# Patient Record
Sex: Male | Born: 1975 | Race: White | Hispanic: No | Marital: Married | Smoking: Never smoker
Health system: Southern US, Community
[De-identification: ages and names within clinical notes are randomized; demographics above are authoritative.]

## PROBLEM LIST (undated history)

## (undated) DIAGNOSIS — R1013 Epigastric pain: Secondary | ICD-10-CM

## (undated) DIAGNOSIS — K769 Liver disease, unspecified: Secondary | ICD-10-CM

## (undated) DIAGNOSIS — E042 Nontoxic multinodular goiter: Secondary | ICD-10-CM

## (undated) DIAGNOSIS — G5600 Carpal tunnel syndrome, unspecified upper limb: Secondary | ICD-10-CM

## (undated) HISTORY — DX: Epigastric pain: R10.13

## (undated) HISTORY — DX: Nontoxic multinodular goiter: E04.2

## (undated) HISTORY — DX: Carpal tunnel syndrome, unspecified upper limb: G56.00

## (undated) HISTORY — DX: Liver disease, unspecified: K76.9

## (undated) HISTORY — PX: MULTIPLE TOOTH EXTRACTIONS: SHX2053

---

## 2006-08-16 ENCOUNTER — Emergency Department: Payer: Self-pay | Admitting: Emergency Medicine

## 2012-08-24 ENCOUNTER — Ambulatory Visit: Payer: Self-pay | Admitting: Gastroenterology

## 2012-08-26 ENCOUNTER — Ambulatory Visit: Payer: Self-pay | Admitting: Gastroenterology

## 2012-08-26 LAB — CREATININE, SERUM
Creatinine: 1.13 mg/dL (ref 0.60–1.30)
EGFR (Non-African Amer.): >60

## 2012-12-08 ENCOUNTER — Ambulatory Visit: Payer: Self-pay | Admitting: Gastroenterology

## 2012-12-28 ENCOUNTER — Ambulatory Visit: Payer: Self-pay | Admitting: Gastroenterology

## 2013-06-15 ENCOUNTER — Ambulatory Visit: Payer: Self-pay | Admitting: Gastroenterology

## 2013-10-11 ENCOUNTER — Emergency Department: Payer: Self-pay | Admitting: Emergency Medicine

## 2013-10-11 LAB — URINALYSIS, COMPLETE
BACTERIA: NONE SEEN
BILIRUBIN, UR: NEGATIVE
BLOOD: NEGATIVE
Glucose,UR: NEGATIVE mg/dL (ref 0–75)
LEUKOCYTE ESTERASE: NEGATIVE
Nitrite: NEGATIVE
PH: 5 (ref 4.5–8.0)
Protein: NEGATIVE
RBC,UR: NONE SEEN /HPF (ref 0–5)
Specific Gravity: 1.028 (ref 1.003–1.030)
Squamous Epithelial: NONE SEEN

## 2013-10-11 LAB — CBC WITH DIFFERENTIAL/PLATELET
BASOS ABS: 0 10*3/uL (ref 0.0–0.1)
Basophil %: 0.4 %
Eosinophil #: 0 10*3/uL (ref 0.0–0.7)
Eosinophil %: 0.1 %
HCT: 42.3 % (ref 40.0–52.0)
HGB: 14.4 g/dL (ref 13.0–18.0)
LYMPHS PCT: 7.1 %
Lymphocyte #: 0.7 10*3/uL — ABNORMAL LOW (ref 1.0–3.6)
MCH: 31.7 pg (ref 26.0–34.0)
MCHC: 34.1 g/dL (ref 32.0–36.0)
MCV: 93 fL (ref 80–100)
Monocyte #: 0.8 x10 3/mm (ref 0.2–1.0)
Monocyte %: 8.3 %
NEUTROS PCT: 84.1 %
Neutrophil #: 7.8 10*3/uL — ABNORMAL HIGH (ref 1.4–6.5)
Platelet: 195 10*3/uL (ref 150–440)
RBC: 4.54 10*6/uL (ref 4.40–5.90)
RDW: 13.1 % (ref 11.5–14.5)
WBC: 9.2 10*3/uL (ref 3.8–10.6)

## 2013-10-11 LAB — COMPREHENSIVE METABOLIC PANEL WITH GFR
Albumin: 3.7 g/dL
Alkaline Phosphatase: 52 U/L
Anion Gap: 6 — ABNORMAL LOW
BUN: 10 mg/dL
Bilirubin,Total: 0.9 mg/dL
Calcium, Total: 8.2 mg/dL — ABNORMAL LOW
Chloride: 104 mmol/L
Co2: 28 mmol/L
Creatinine: 1.2 mg/dL
EGFR (African American): >60
EGFR (Non-African Amer.): >60
Glucose: 116 mg/dL — ABNORMAL HIGH
Osmolality: 276
Potassium: 4 mmol/L
SGOT(AST): 23 U/L
SGPT (ALT): 20 U/L
Sodium: 138 mmol/L
Total Protein: 7.3 g/dL

## 2013-10-11 LAB — LIPASE, BLOOD: Lipase: 116 U/L

## 2014-06-17 IMAGING — CR DG THORACIC SPINE 2-3V
1 series · 3 of 3 positions shown · non-contrast
Comparison: none

REASON FOR EXAM: [DATE] Abdominal pain  Left upper Quadrant
COMMENTS:

PROCEDURE:     DXR - DXR THORACIC  AP AND LATERAL  - December 28, 2012 [DATE]
RESULT:     Thoracic spine images demonstrate multilevel mild degenerative
disc narrowing with some degenerative hypertrophic endplate spurring. A
definite thoracic compression fractures not appreciated.

[Series 1: t thoracic spine ap · 0.14mm/px · 3 of 3 slices shown]
[im 1/3]
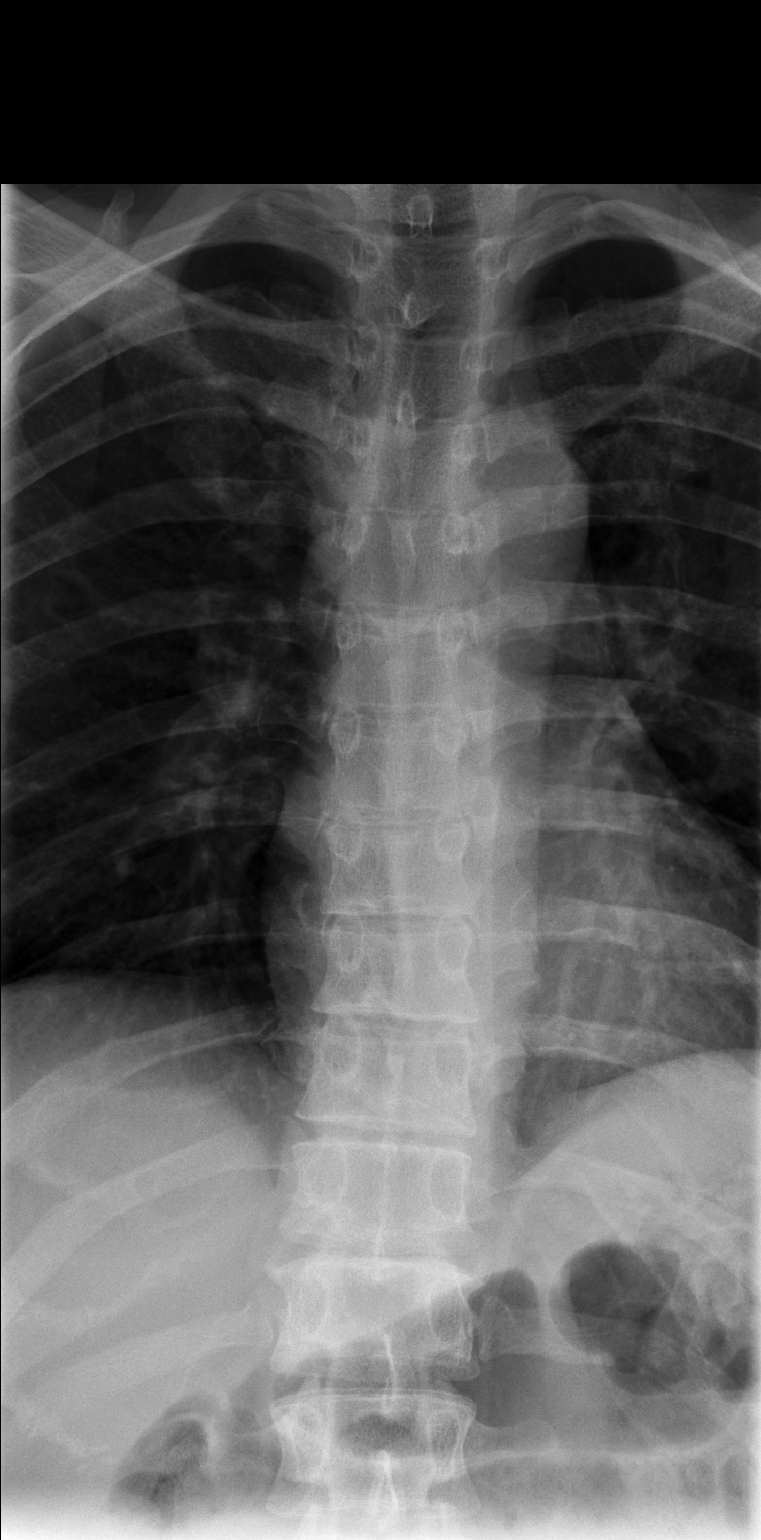
[im 2/3]
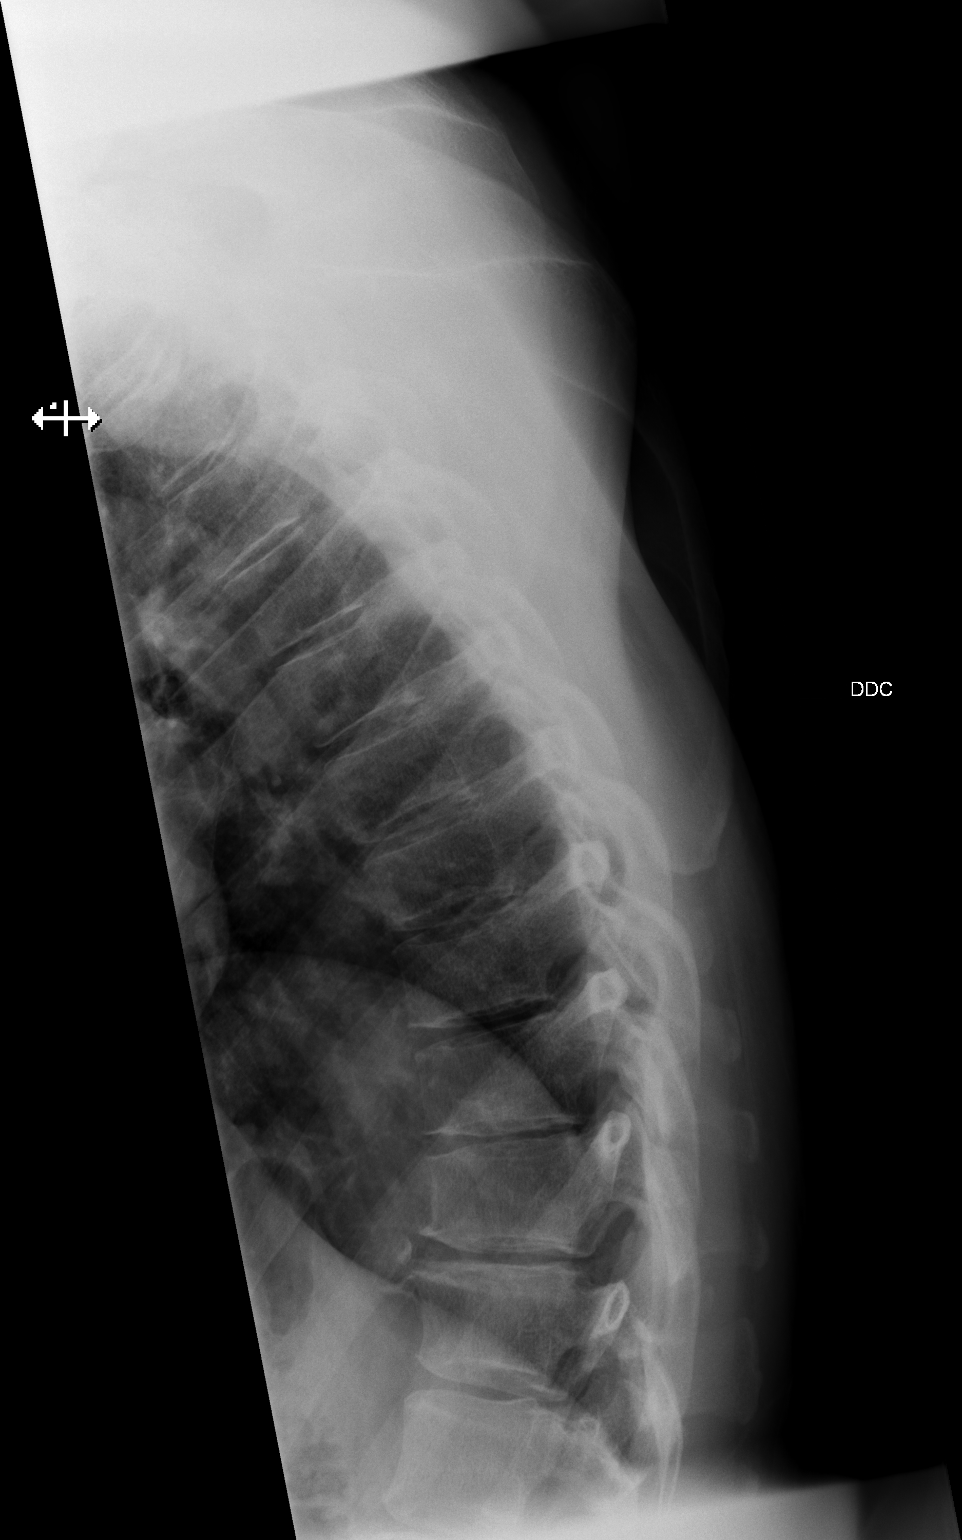
[im 3/3]
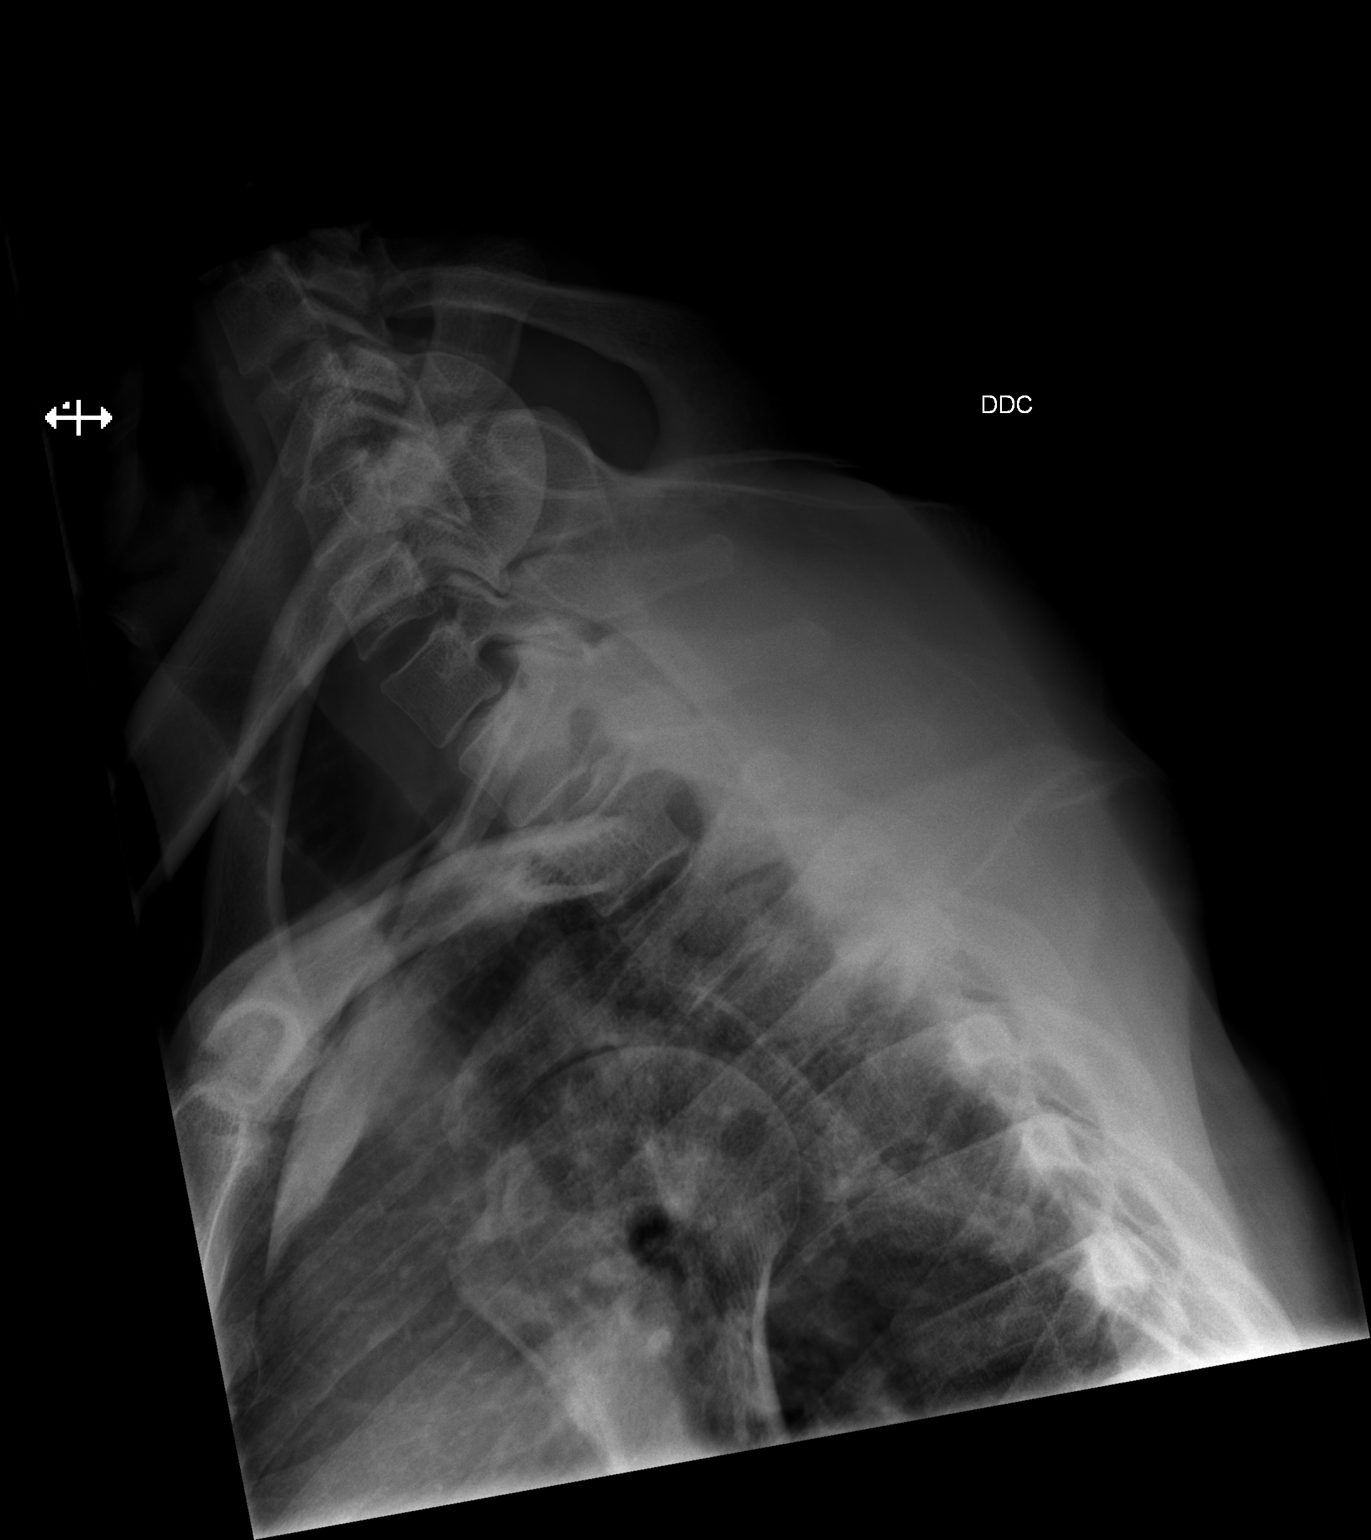

[3 of 3 positions shown; findings below may reference images not displayed]

IMPRESSION: No focal thoracic bony abnormality. MRI followup would be
recommended if the patient has persistent or worsening symptoms.

[REDACTED]

## 2014-11-19 ENCOUNTER — Other Ambulatory Visit: Payer: Self-pay | Admitting: Neurology

## 2014-11-19 DIAGNOSIS — R200 Anesthesia of skin: Secondary | ICD-10-CM

## 2014-11-19 DIAGNOSIS — R202 Paresthesia of skin: Secondary | ICD-10-CM

## 2014-11-19 DIAGNOSIS — M542 Cervicalgia: Secondary | ICD-10-CM

## 2014-11-21 ENCOUNTER — Ambulatory Visit
Admission: RE | Admit: 2014-11-21 | Discharge: 2014-11-21 | Disposition: A | Discharge status: Home / Self Care | Payer: BLUE CROSS/BLUE SHIELD | Source: Ambulatory Visit | Attending: Neurology | Admitting: Neurology

## 2014-11-21 DIAGNOSIS — R202 Paresthesia of skin: Secondary | ICD-10-CM | POA: Diagnosis present

## 2014-11-21 DIAGNOSIS — M542 Cervicalgia: Secondary | ICD-10-CM | POA: Diagnosis present

## 2014-11-21 DIAGNOSIS — R200 Anesthesia of skin: Secondary | ICD-10-CM | POA: Diagnosis present

## 2014-11-21 DIAGNOSIS — M4802 Spinal stenosis, cervical region: Secondary | ICD-10-CM | POA: Insufficient documentation

## 2015-11-12 ENCOUNTER — Other Ambulatory Visit: Payer: Self-pay | Admitting: Gastroenterology

## 2015-11-12 DIAGNOSIS — K769 Liver disease, unspecified: Secondary | ICD-10-CM

## 2015-11-21 ENCOUNTER — Ambulatory Visit: Admission: RE | Admit: 2015-11-21 | Payer: BLUE CROSS/BLUE SHIELD | Source: Ambulatory Visit

## 2015-12-06 ENCOUNTER — Ambulatory Visit
Admission: RE | Admit: 2015-12-06 | Discharge: 2015-12-06 | Disposition: A | Discharge status: Home / Self Care | Payer: BLUE CROSS/BLUE SHIELD | Source: Ambulatory Visit | Attending: Gastroenterology | Admitting: Gastroenterology

## 2015-12-06 DIAGNOSIS — D1803 Hemangioma of intra-abdominal structures: Secondary | ICD-10-CM | POA: Diagnosis not present

## 2015-12-06 DIAGNOSIS — K769 Liver disease, unspecified: Secondary | ICD-10-CM

## 2015-12-06 DIAGNOSIS — K760 Fatty (change of) liver, not elsewhere classified: Secondary | ICD-10-CM | POA: Insufficient documentation

## 2015-12-06 DIAGNOSIS — K7689 Other specified diseases of liver: Secondary | ICD-10-CM | POA: Diagnosis present

## 2015-12-06 DIAGNOSIS — R1013 Epigastric pain: Secondary | ICD-10-CM | POA: Insufficient documentation

## 2015-12-06 DIAGNOSIS — R160 Hepatomegaly, not elsewhere classified: Secondary | ICD-10-CM | POA: Diagnosis not present

## 2015-12-06 MED ORDER — GADOBENATE DIMEGLUMINE 529 MG/ML IV SOLN
20.0000 mL | Freq: Once | INTRAVENOUS | Status: AC | PRN
  Administered 2015-12-06: 20 mL via INTRAVENOUS

## 2016-07-31 ENCOUNTER — Ambulatory Visit: Payer: Self-pay | Admitting: Allergy

## 2016-09-18 ENCOUNTER — Ambulatory Visit (INDEPENDENT_AMBULATORY_CARE_PROVIDER_SITE_OTHER): Payer: BLUE CROSS/BLUE SHIELD | Admitting: Allergy

## 2016-09-18 ENCOUNTER — Encounter: Payer: Self-pay | Admitting: Allergy

## 2016-09-18 VITALS — BP 134/80 | HR 85 | Temp 97.8°F | Resp 18 | Ht 70.5 in | Wt 234.8 lb

## 2016-09-18 DIAGNOSIS — L508 Other urticaria: Secondary | ICD-10-CM | POA: Diagnosis not present

## 2016-09-18 LAB — COMPREHENSIVE METABOLIC PANEL
ALBUMIN: 4.2 g/dL (ref 3.6–5.1)
ALK PHOS: 61 U/L (ref 40–115)
ALT: 22 U/L (ref 9–46)
AST: 18 U/L (ref 10–40)
BILIRUBIN TOTAL: 0.6 mg/dL (ref 0.2–1.2)
BUN: 11 mg/dL (ref 7–25)
CALCIUM: 8.8 mg/dL (ref 8.6–10.3)
CO2: 26 mmol/L (ref 20–31)
CREATININE: 1.32 mg/dL (ref 0.60–1.35)
Chloride: 106 mmol/L (ref 98–110)
Glucose, Bld: 87 mg/dL (ref 65–99)
Potassium: 4.3 mmol/L (ref 3.5–5.3)
SODIUM: 139 mmol/L (ref 135–146)
Total Protein: 6.5 g/dL (ref 6.1–8.1)

## 2016-09-18 LAB — CBC WITH DIFFERENTIAL/PLATELET
BASOS ABS: 53 {cells}/uL (ref 0–200)
BASOS PCT: 1 %
EOS ABS: 53 {cells}/uL (ref 15–500)
EOS PCT: 1 %
HCT: 42.2 % (ref 38.5–50.0)
Hemoglobin: 14.4 g/dL (ref 13.2–17.1)
LYMPHS PCT: 21 %
Lymphs Abs: 1113 cells/uL (ref 850–3900)
MCH: 30.9 pg (ref 27.0–33.0)
MCHC: 34.1 g/dL (ref 32.0–36.0)
MCV: 90.6 fL (ref 80.0–100.0)
MONOS PCT: 10 %
MPV: 10.3 fL (ref 7.5–12.5)
Monocytes Absolute: 530 cells/uL (ref 200–950)
NEUTROS ABS: 3551 {cells}/uL (ref 1500–7800)
Neutrophils Relative %: 67 %
PLATELETS: 213 10*3/uL (ref 140–400)
RBC: 4.66 MIL/uL (ref 4.20–5.80)
RDW: 13.7 % (ref 11.0–15.0)
WBC: 5.3 10*3/uL (ref 3.8–10.8)

## 2016-09-18 MED ORDER — MONTELUKAST SODIUM 10 MG PO TABS: 10.0000 mg | ORAL_TABLET | Freq: Every day | ORAL | 5 refills | Status: DC

## 2016-09-18 NOTE — Progress Notes (Signed)
New Patient Note  RE: David Fleming MRN: [id number] DOB: January 09, 1976 Date of Office Visit: 09/18/2016  Referring provider: Vesta Mixer Primary care provider: Antionette Fairy, PA-C  Chief Complaint: hives  History of present illness: David Fleming is a 41 y.o. male presenting today for evaluation of recurrent hives. The patient states that his first episode of hives was in 2013. He states at that time he was not on any medications, and denies any changes to his lifestyle. Since that time he has been experiencing hives at least weekly and up to four times a week. The time of presentation is variable and sporadic, and he denies any association with a particular time of day or location when it occurs. The rash typically occurs around his abdomen, and then will spread to his legs. Occasionally, it will occur on his forearms. He states that his upper body, including his chest and upper back, and face are not affected. The rash will last up to 5 hours on average. He states that it is very itchy. He denies associated swelling or discoloration and bruising. He denies fever when the hives occur, but states he at times becomes nauseated. He denies any recent changes in medications or medication dosage. He denies any recent infections. He also denies joint swelling or pain associated with the rash.  No preceding illness, change in medications, stings, change in soaps/lotions/detergents or new foods.  He also denies association with particular foods. The patient states years ago he has allergy testing, which was positive for shellfish. He states when he eats shellfish he becomes nauseated, but denies throat swelling or difficulty breathing.  He currently is taking Zyrtec at night and an allegra in the morning.   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 He follows with an Endocrinologist. His most recent FNA biopsy results were benign.   Review of systems: Review of Systems    Constitutional: Negative.   HENT: Negative.   Eyes: Negative.   Respiratory: Negative.   Cardiovascular: Negative.   Gastrointestinal: Positive for nausea.  Genitourinary: Negative.   Musculoskeletal: Negative.   Skin: Positive for itching and rash.  Neurological: Negative.   Endo/Heme/Allergies: Negative.   Psychiatric/Behavioral: Negative.     All other systems negative unless noted above in HPI  Past medical history: Past Medical History:  Diagnosis Date  . Carpal tunnel syndrome   . Dyspepsia   . Liver lesion   . Multinodular goiter     Past surgical history: History reviewed. No pertinent surgical history.  Family history:  Family History  Problem Relation Age of Onset  . Cancer Mother   . Asthma Father     Social history: She lives in a home with carpeting with electric heating and central cooling. There is a dog in the home. There are cats and dogs outside the home. He works as a Biomedical scientist. He has no smoking history but he does chewing tobacco 1 pack a day for the past 3 years.   Medication List: Allergies as of 09/18/2016      Reactions   Shellfish-derived Products    Other reaction(s): Unknown      Medication List       Accurate as of 09/18/16  3:07 PM. Always use your most recent med list.          Cetirizine HCl 10 MG Caps Take by mouth.   DULoxetine 60 MG capsule Commonly known as:  CYMBALTA   montelukast 10 MG  tablet Commonly known as:  SINGULAIR Take 1 tablet (10 mg total) by mouth at bedtime.   pantoprazole 40 MG tablet Commonly known as:  PROTONIX   ranitidine 150 MG capsule Commonly known as:  ZANTAC Take by mouth.       Known medication allergies: Allergies  Allergen Reactions  . Shellfish-Derived Products     Other reaction(s): Unknown     Physical examination: Blood pressure 134/80, pulse 85, temperature 97.8 F (36.6 C), resp. rate 18, height 5' 10.5" (1.791 m), weight 234 lb 12.8 oz (106.5 kg), SpO2  97 %.  General: Alert, interactive, in no acute distress. HEENT: TMs pearly gray, turbinates non-edematous without discharge, post-pharynx unremarkable. Neck: Supple without lymphadenopathy. Lungs: Clear to auscultation without wheezing, rhonchi or rales. {no increased work of breathing. CV: Normal S1, S2 without murmurs. Abdomen: Nondistended, nontender. Skin: Multiple, circumscribed, raised, erythematous plaques with central pallor on abdomen bilaterally c/w urticaria. . Extremities:  No clubbing, cyanosis or edema. Neuro:   Grossly intact.  Diagnositics/Labs: Labs: None   Allergy testing: deferred due to ongoing urticaria  Assessment and plan:   1. Chronic Urticaria -Start Singulair 10 mg daily at bedtime -Continue Zyrtec once daily at bedtime and Allegra once daily in the morning. If needed take up to two tablets at a time. -Start Ranitidine 150 mg twice daily  -Obtain lab work: CBC w diff, CMP, chronic hive panel  -If frequency, duration, or severity of the hives do not improve within 1-2 months, consider Xolair (omalizumab)  -Let us know if you start to have swelling, fever, joint aches and pains, or bruising    Return in about 2 months (around 11/19/2016).   Rochele Pages, MD Internal Medicine PGY1

## 2016-09-18 NOTE — Patient Instructions (Addendum)
1. Chronic Urticaria -Start Singulair 10 mg daily at bedtime -Continue Zyrtec once daily at bedtime and Allegra once daily in the morning. If needed take up to two tablets at a time. -Start Ranitidine 150 mg twice daily  -Obtain lab work  -If frequency, duration, or severity of the hives do not improve within 1-2 months, consider Xolair (omalizumab)  -Let us know if you start to have swelling, fever, joint aches and pains, or bruising

## 2016-09-21 LAB — CP584 ZONE 3
Allergen, A. alternata, m6: <0.1 kU/L
Allergen, Black Locust, Acacia9: <0.1 kU/L
Allergen, Comm Silver Birch, t9: <0.1 kU/L
Allergen, D pternoyssinus,d7: 1.68 kU/L — ABNORMAL HIGH
Allergen, Mucor Racemosus, M4: <0.1 kU/L
Box Elder IgE: <0.1 kU/L
Cockroach: <0.1 kU/L
Common Ragweed: <0.1 kU/L
D. FARINAE: 1.39 kU/L — AB
Dog Dander: <0.1 kU/L
Nettle: <0.1 kU/L
Plantain: <0.1 kU/L

## 2016-09-28 LAB — CP CHRONIC URTICARIA INDEX PANEL
Histamine Release: <16 % (ref <16)
TSH: 0.46 mIU/L (ref 0.40–4.50)
Thyroglobulin Ab: <1 IU/mL (ref <2)
Thyroperoxidase Ab SerPl-aCnc: <1 IU/mL (ref <9)

## 2016-10-14 ENCOUNTER — Telehealth: Payer: Self-pay

## 2016-10-14 MED ORDER — RANITIDINE HCL 150 MG PO TABS: 150.0000 mg | ORAL_TABLET | Freq: Two times a day (BID) | ORAL | 5 refills | Status: DC

## 2016-10-14 NOTE — Telephone Encounter (Signed)
Pharmacy called and stated that the patient tod them that he was to get a script for zantac. I looked in his last note and he the doctor did state that a script for zantac to be sent into the pharmacy. I sent the script.

## 2016-11-19 ENCOUNTER — Ambulatory Visit: Payer: BLUE CROSS/BLUE SHIELD | Admitting: Allergy

## 2017-01-25 ENCOUNTER — Ambulatory Visit: Payer: BLUE CROSS/BLUE SHIELD | Admitting: Allergy

## 2017-01-25 DIAGNOSIS — J309 Allergic rhinitis, unspecified: Secondary | ICD-10-CM

## 2017-05-27 ENCOUNTER — Other Ambulatory Visit: Payer: Self-pay

## 2017-05-27 NOTE — Telephone Encounter (Signed)
Patient is overdue for office visit. Denial for ranitidine being sent back to pharmacy.

## 2018-08-17 ENCOUNTER — Other Ambulatory Visit: Payer: Self-pay | Admitting: Family Medicine

## 2018-08-17 DIAGNOSIS — N489 Disorder of penis, unspecified: Secondary | ICD-10-CM

## 2018-08-25 ENCOUNTER — Other Ambulatory Visit: Payer: Self-pay

## 2018-08-25 ENCOUNTER — Ambulatory Visit
Admission: RE | Admit: 2018-08-25 | Discharge: 2018-08-25 | Disposition: A | Discharge status: Home / Self Care | Payer: BLUE CROSS/BLUE SHIELD | Source: Ambulatory Visit | Attending: Family Medicine | Admitting: Family Medicine

## 2018-08-25 DIAGNOSIS — N489 Disorder of penis, unspecified: Secondary | ICD-10-CM | POA: Diagnosis present

## 2019-08-04 ENCOUNTER — Other Ambulatory Visit: Payer: Self-pay | Admitting: Gastroenterology

## 2019-08-04 DIAGNOSIS — K862 Cyst of pancreas: Secondary | ICD-10-CM

## 2019-08-04 MED ORDER — SUCRALFATE 1 GM/10ML PO SUSP
1.00 | ORAL | Status: DC
Start: 2019-08-03 — End: 2019-08-04

## 2020-02-12 IMAGING — US US PELVIS LIMITED
1 series · 14 of 22 positions shown · non-contrast
Comparison: None.

CLINICAL DATA: Palpable lump along anterior shaft of penis, distal
aspect near glans

EXAM:
LIMITED ULTRASOUND OF PELVIS
TECHNIQUE: Limited transabdominal ultrasound examination of the pelvis was
performed.

[Series 1: us pelvis limited · 22 acquisitions, 14 frames shown]
[im 1/22]
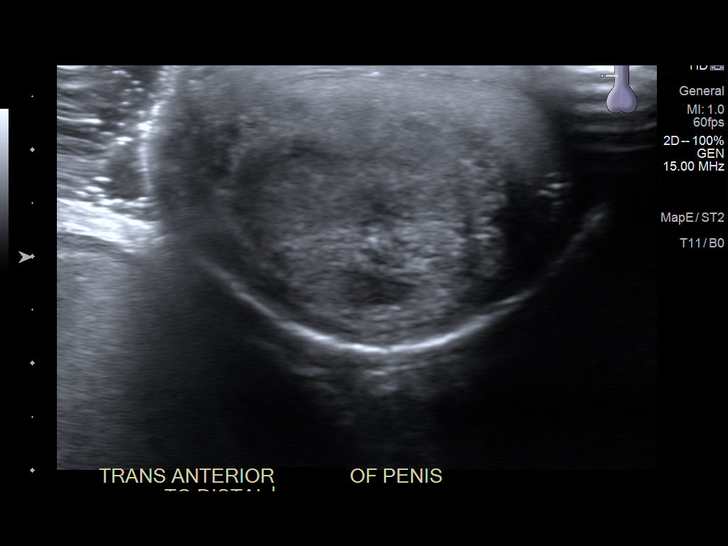
[im 3/22]
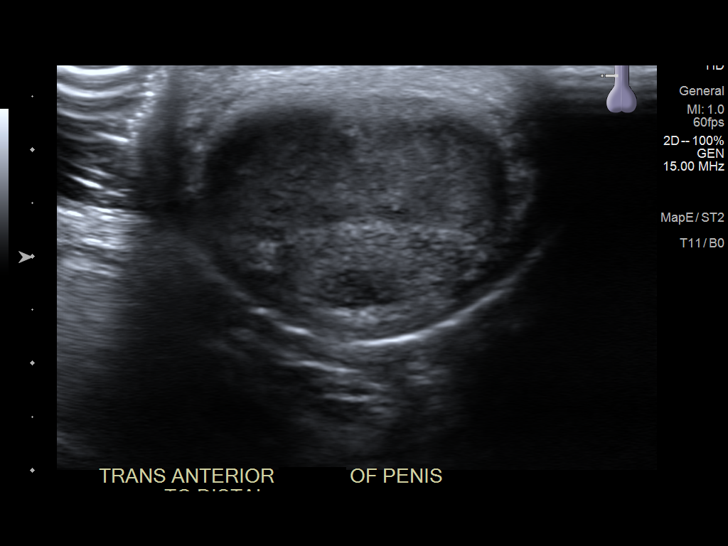
[im 4/22]
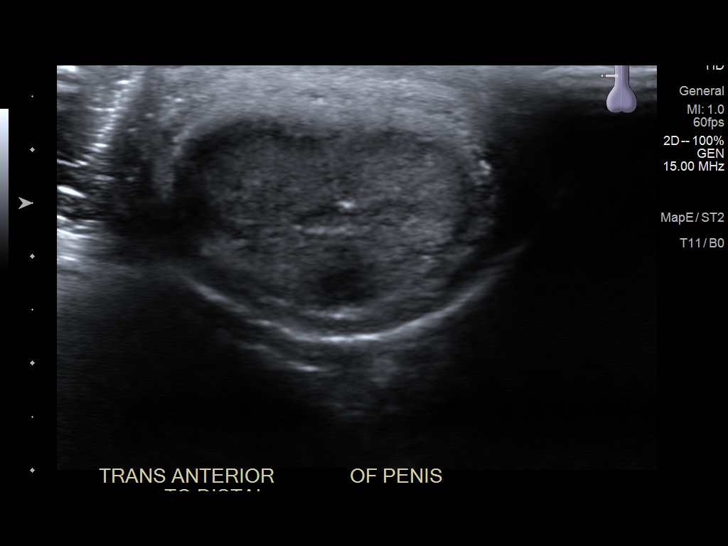
[im 6/22]
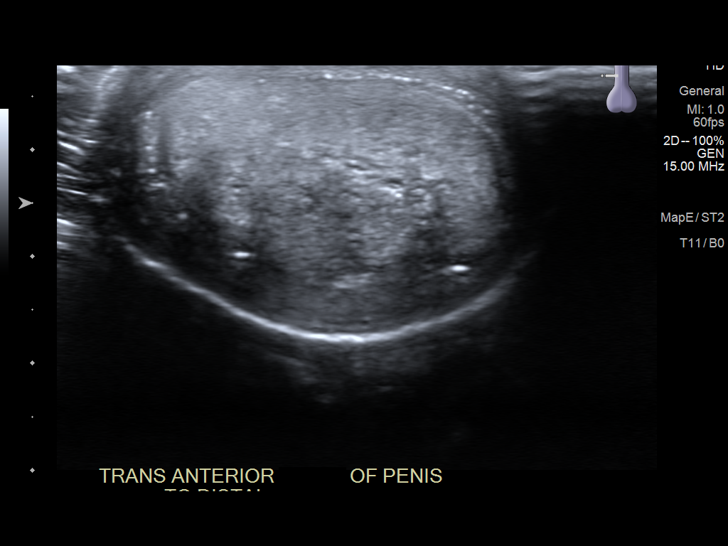
[im 8/22]
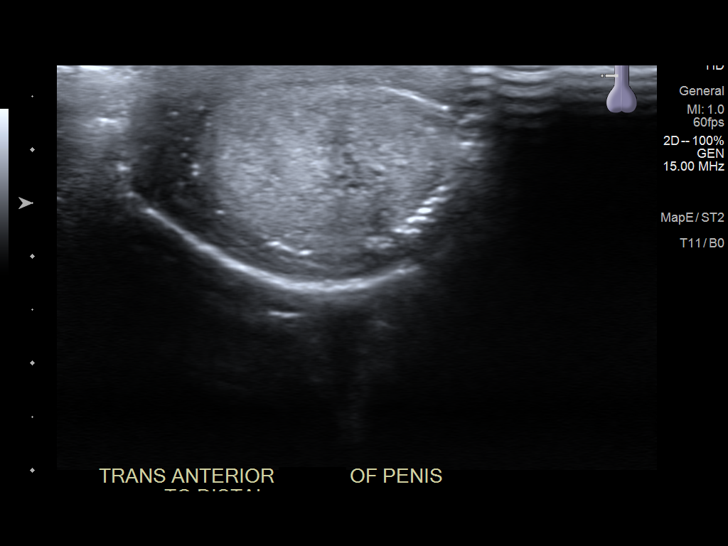
[im 9/22]
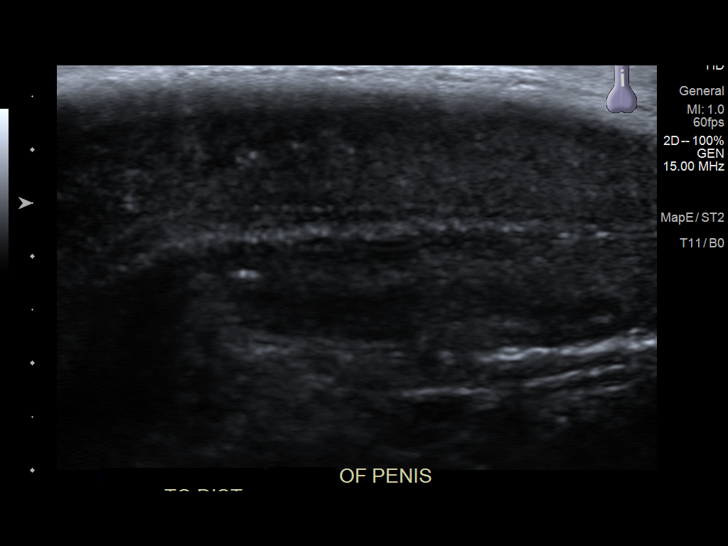
[im 11/22]
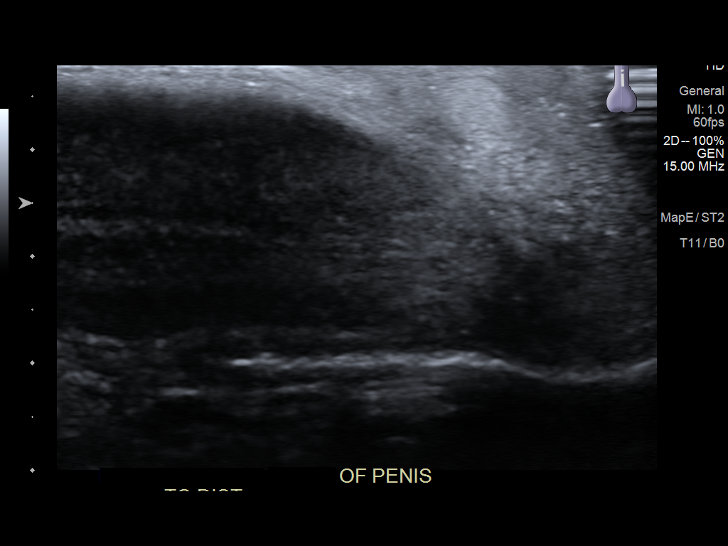
[im 12/22]
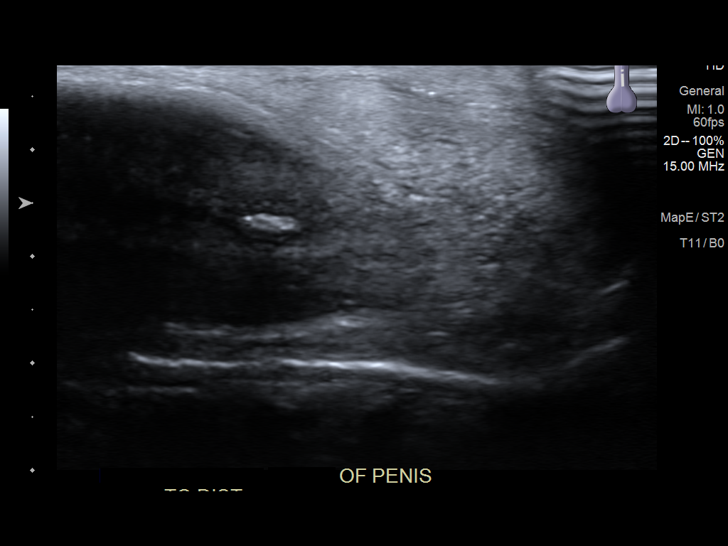
[im 14/22]
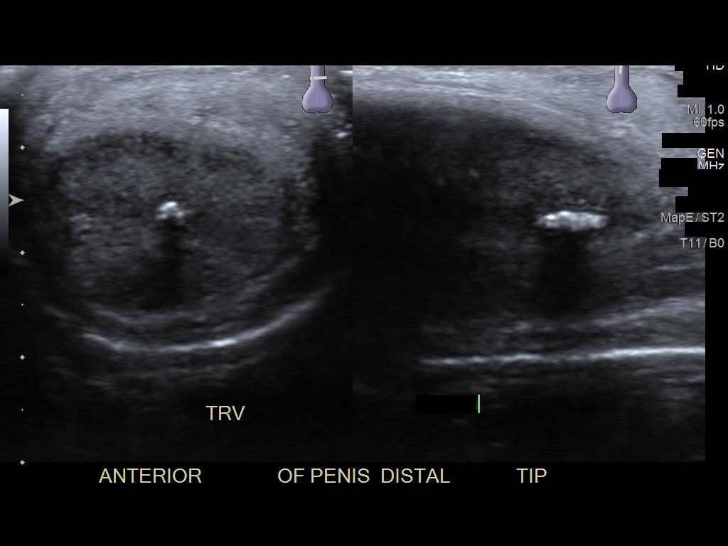
[im 15/22]
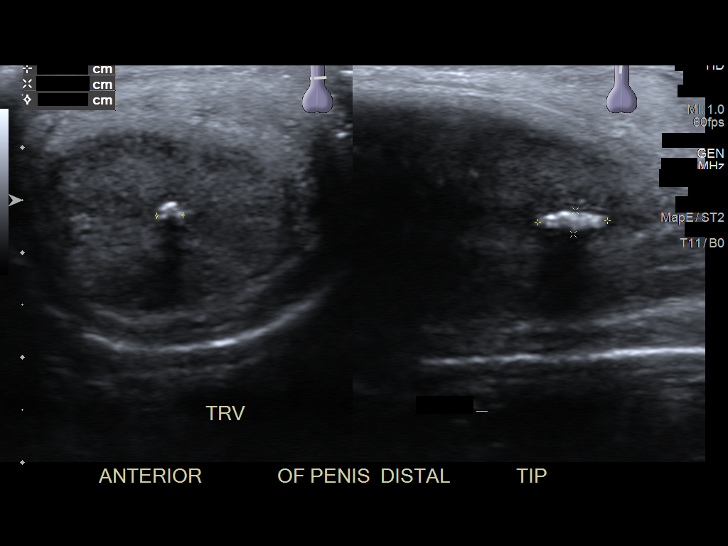
[im 17/22]
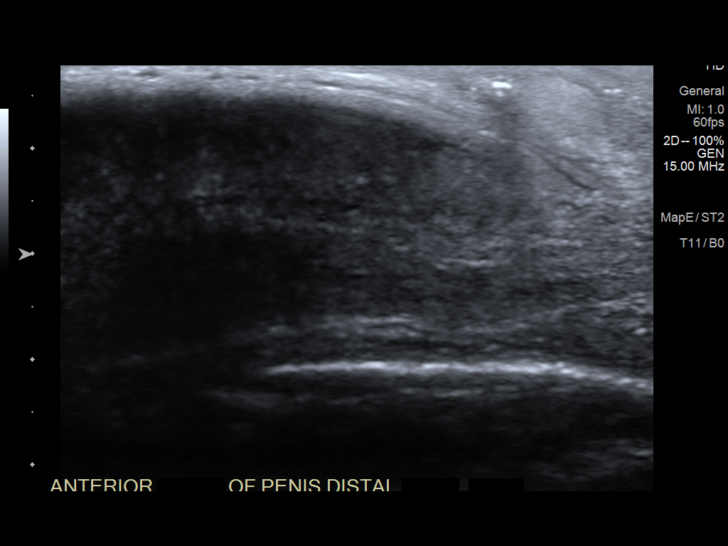
[im 19/22]
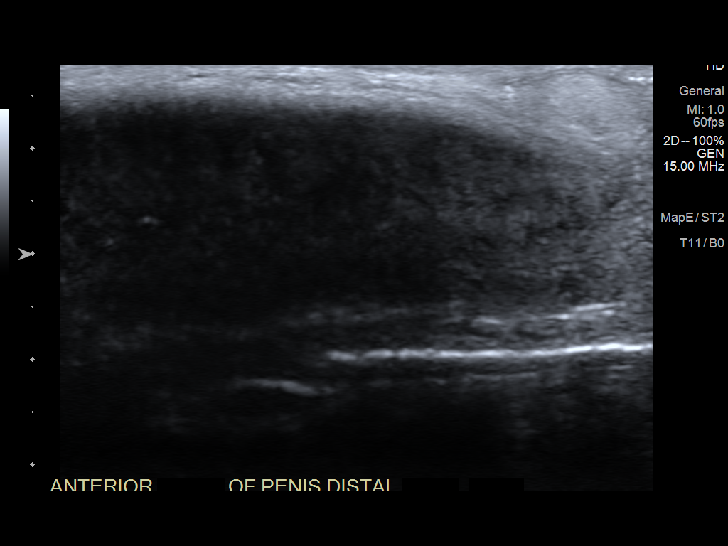
[im 20/22]
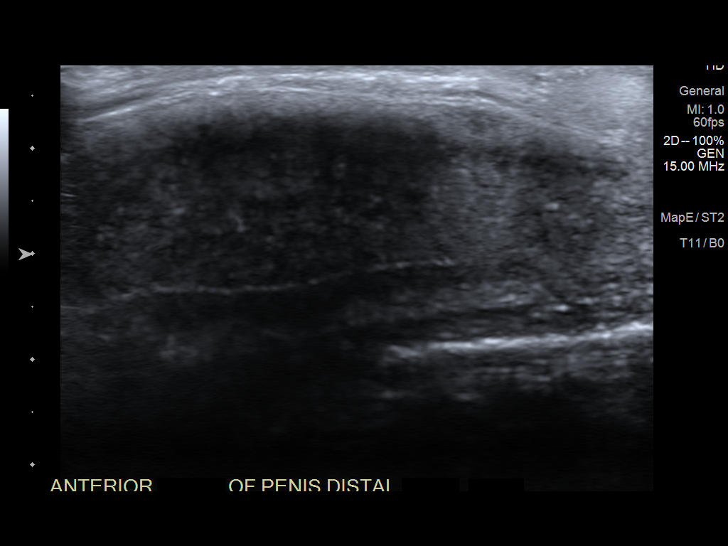
[im 22/22]
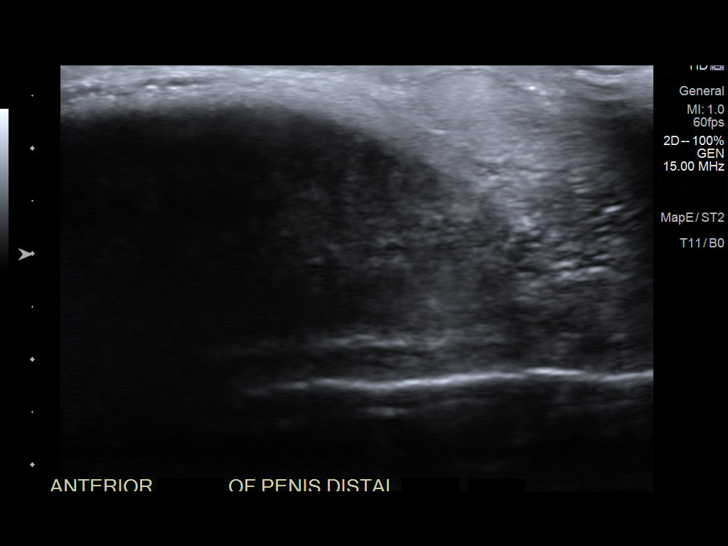

[14 of 22 positions shown; findings below may reference images not displayed]

FINDINGS: Targeted ultrasound was performed along the distal penile shaft.

No sonographic abnormality along the superficial skin surface.
However, a 7 x 2 x 3 mm calcification is present along the ventral
aspect of the corpora cavernosa in the area of clinical concern,
along the distal penis near the tip, likely reflecting a midline
tunica albuginea calcification. This appearance is suspicious for
early Peyronie disease.
IMPRESSION: Suspected 7 mm midline tunica albuginea calcification along the
ventral aspect of the corpora cavernosa, in the area of clinical
concern, suspicious for early Peyronie disease. Urologic
consultation is suggested.

## 2021-03-14 DIAGNOSIS — E042 Nontoxic multinodular goiter: Secondary | ICD-10-CM | POA: Diagnosis not present

## 2021-03-20 DIAGNOSIS — J329 Chronic sinusitis, unspecified: Secondary | ICD-10-CM | POA: Diagnosis not present

## 2021-05-01 ENCOUNTER — Other Ambulatory Visit: Payer: Self-pay

## 2021-05-01 ENCOUNTER — Encounter: Payer: Self-pay | Admitting: Urology

## 2021-05-01 ENCOUNTER — Ambulatory Visit: Payer: 59 | Admitting: Urology

## 2021-05-01 VITALS — BP 127/81 | HR 76 | Wt 254.0 lb

## 2021-05-01 DIAGNOSIS — N4889 Other specified disorders of penis: Secondary | ICD-10-CM | POA: Diagnosis not present

## 2021-05-01 DIAGNOSIS — N5201 Erectile dysfunction due to arterial insufficiency: Secondary | ICD-10-CM | POA: Diagnosis not present

## 2021-05-01 DIAGNOSIS — N486 Induration penis plastica: Secondary | ICD-10-CM

## 2021-05-01 LAB — URINALYSIS, ROUTINE W REFLEX MICROSCOPIC
Bilirubin, UA: NEGATIVE
Glucose, UA: NEGATIVE
Ketones, UA: NEGATIVE
Leukocytes,UA: NEGATIVE
Nitrite, UA: NEGATIVE
Protein,UA: NEGATIVE
RBC, UA: NEGATIVE
Specific Gravity, UA: 1.015 (ref 1.005–1.030)
Urobilinogen, Ur: 0.2 mg/dL (ref 0.2–1.0)
pH, UA: 7.5 (ref 5.0–7.5)

## 2021-05-01 NOTE — Progress Notes (Signed)
?Subjective: ?1. Pain in penis   ?2. 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  ?David Fleming is a 46 yo male who presents for a history of Peyronie's disease with a knot at the base of his penis with curvature and penile pain.   He has decreased tumescence with less that a 50% erection and a 45 degree curvature.  He has some discomfort with voiding as well.  He has seen urologists previously including Dr. Rogers Blocker who gave him Cialis and Vit E.  It has gotten progressively worse.  He can get a sufficient erection for intercourse but he has trouble maintaining the erection.   He has mild/mod LUTS with an IPSS of 12.  He has nocturia x 2 but doesn't wake with urgency.  He had been on Cymbalta for anxiety but he is off of that.   ? ?ROS: ? ?ROS ? ?Allergies  ?Allergen Reactions  ? Shellfish Allergy Hives and Other (See Comments)  ?  Other reaction(s): Other (See Comments) ?Other reaction(s): Unknown ?Told on skin testing that he is extremely allergic ?Other reaction(s): Unknown ?Told on skin testing that he is extremely allergic ?Told on skin testing that he is extremely allergic ?Told on skin testing that he is extremely allergic ?  ? Shellfish-Derived Products   ?  Other reaction(s): Unknown  ? ? ?Past Medical History:  ?Diagnosis Date  ? Carpal tunnel syndrome   ? Dyspepsia   ? Liver lesion   ? Multinodular goiter   ? ? ?History reviewed. No pertinent surgical history. ? ?Social History  ? ?Socioeconomic History  ? Marital status: Married  ?  Spouse name: Not on file  ? Number of children: Not on file  ? Years of education: Not on file  ? Highest education level: Not on file  ?Occupational History  ? Not on file  ?Tobacco Use  ? Smoking status: Never  ? Smokeless tobacco: Current  ?Vaping Use  ? Vaping Use: Never used  ?Substance and Sexual Activity  ? Alcohol use: Yes  ? Drug use: No  ? Sexual activity: Not on file  ?Other Topics Concern  ? Not on file  ?Social History Narrative  ? Not on  file  ? ?Social Determinants of Health  ? ?Financial Resource Strain: Not on file  ?Food Insecurity: Not on file  ?Transportation Needs: Not on file  ?Physical Activity: Not on file  ?Stress: Not on file  ?Social Connections: Not on file  ?Intimate Partner Violence: Not on file  ? ? ?Family History  ?Problem Relation Age of Onset  ? Cancer Mother   ? Asthma Father   ? ? ?Anti-infectives: ?Anti-infectives (From admission, onward)  ? ? None  ? ?  ? ? ?Current Outpatient Medications  ?Medication Sig Dispense Refill  ? Cetirizine HCl 10 MG CAPS Take by mouth.    ? pantoprazole (PROTONIX) 40 MG tablet   1  ? ?No current facility-administered medications for this visit.  ? ? ? ?Objective: ?Vital signs in last 24 hours: ?BP 127/81   Pulse 76   Wt 254 lb (115.2 kg)   BMI 35.93 kg/m?  ? ?Intake/Output from previous day: ?No intake/output data recorded. ?Intake/Output this shift: ?@IOTHISSHIFT @ ? ? ?Physical Exam ?Vitals reviewed.  ?Constitutional:   ?   Appearance: Normal appearance.  ?Abdominal:  ?   General: Abdomen is flat.  ?   Palpations: Abdomen is soft.  ?   Hernia: No hernia is present.  ?  Genitourinary: ?   Comments: Uncirc phallus with adequate meatus.  Mid to proximal dorsal and ventral plaque with some tenderness.    ?Scrotum, testes and epididymis normal.  ?Neurological:  ?   Mental Status: He is alert.  ? ? ?Lab Results:  ?Results for orders placed or performed in visit on 05/01/21 (from the past 24 hour(s))  ?Urinalysis, Routine w reflex microscopic     Status: None  ? Collection Time: 05/01/21  3:13 PM  ?Result Value Ref Range  ? Specific Gravity, UA 1.015 1.005 - 1.030  ? pH, UA 7.5 5.0 - 7.5  ? Color, UA Yellow Yellow  ? Appearance Ur Clear Clear  ? Leukocytes,UA Negative Negative  ? Protein,UA Negative Negative/Trace  ? Glucose, UA Negative Negative  ? Ketones, UA Negative Negative  ? RBC, UA Negative Negative  ? Bilirubin, UA Negative Negative  ? Urobilinogen, Ur 0.2 0.2 - 1.0 mg/dL  ? Nitrite, UA  Negative Negative  ? Microscopic Examination Comment   ? Narrative  ? Performed at:  [facility] ?[address], [place], [place]  [id number] ?Lab Director: [place], Phone:  [phone]  ?  ?BMET ?No results for input(s): NA, K, CL, CO2, GLUCOSE, BUN, CREATININE, CALCIUM in the last 72 hours. ?PT/INR ?No results for input(s): LABPROT, INR in the last 72 hours. ?ABG ?No results for input(s): PHART, HCO3 in the last 72 hours. ? ?Invalid input(s): PCO2, PO2 ? ?Studies/Results: ?No results found. ? ? ?Assessment/Plan: ?Peyronie's disease.   He has a 45 deg dorsal curve with mild pain and mild/mod ED.  I discussed penile traction therapy, Xiaflex and surgical options and will have him see Dr. Alyson Ingles for consideration of Xiaflex.  ? ?No orders of the defined types were placed in this encounter. ?  ? ?Orders Placed This Encounter  ?Procedures  ? Urinalysis, Routine w reflex microscopic  ?  ? ?Return for ASAP with Dr. Alyson Ingles for evaluation for Xiaflex.   .  ? ? ?CC: [place]. ? ? ? ? ?Irine Seal ?05/01/2021 ?[phone] ?

## 2021-05-08 DIAGNOSIS — J329 Chronic sinusitis, unspecified: Secondary | ICD-10-CM | POA: Diagnosis not present

## 2021-05-08 DIAGNOSIS — R0989 Other specified symptoms and signs involving the circulatory and respiratory systems: Secondary | ICD-10-CM | POA: Diagnosis not present

## 2021-05-08 DIAGNOSIS — Z6834 Body mass index (BMI) 34.0-34.9, adult: Secondary | ICD-10-CM | POA: Diagnosis not present

## 2021-05-19 ENCOUNTER — Ambulatory Visit (INDEPENDENT_AMBULATORY_CARE_PROVIDER_SITE_OTHER): Payer: 59 | Admitting: Urology

## 2021-05-19 ENCOUNTER — Other Ambulatory Visit: Payer: Self-pay

## 2021-05-19 ENCOUNTER — Encounter: Payer: Self-pay | Admitting: Urology

## 2021-05-19 VITALS — BP 133/87 | HR 76 | Wt 250.0 lb

## 2021-05-19 DIAGNOSIS — N486 Induration penis plastica: Secondary | ICD-10-CM | POA: Diagnosis not present

## 2021-05-19 MED ORDER — PENTOXIFYLLINE ER 400 MG PO TBCR: 400.0000 mg | EXTENDED_RELEASE_TABLET | Freq: Three times a day (TID) | ORAL | 2 refills | Status: DC

## 2021-05-19 NOTE — Progress Notes (Addendum)
? ?05/19/2021 ?9:48 AM  ? ?David Fleming ?June 19, 1975 ?[id number] ? ?Referring provider: Alfonse Flavors, MD ?[address] ?[place],  [place] [address] ? ?Peyronies disease ? ?HPI: ?David Fleming is a 46yo here for evaluation of peyronies disease. Over the past year he has noted stable dorsal curvature at the mid to base of the penile shaft. He has intermittent penile pain after the completion of intercourse. His erection is semifirm after the plaque. He has been able to feel a knot for 2 years. He previously saw Dr. Rogers Blocker and was prescribed Vitamin E and cialis. Based on pictures provided by the patient he has 45 degree dorsal curvature at the mid penile shaft. No significant LUTS.  ? ? ?PMH: ?Past Medical History:  ?Diagnosis Date  ? Carpal tunnel syndrome   ? Dyspepsia   ? Liver lesion   ? Multinodular goiter   ? ? ?Surgical History: ?No past surgical history on file. ? ?Home Medications:  ?Allergies as of 05/19/2021   ? ?   Reactions  ? Shellfish Allergy Hives, Other (See Comments)  ? Other reaction(s): Other (See Comments) ?Other reaction(s): Unknown ?Told on skin testing that he is extremely allergic ?Other reaction(s): Unknown ?Told on skin testing that he is extremely allergic ?Told on skin testing that he is extremely allergic ?Told on skin testing that he is extremely allergic  ? Shellfish-derived Products   ? Other reaction(s): Unknown  ? ?  ? ?  ?Medication List  ?  ? ?  ? Accurate as of May 19, 2021  9:48 AM. If you have any questions, ask your nurse or doctor.  ?  ?  ? ?  ? ?Cetirizine HCl 10 MG Caps ?Take by mouth. ?  ?pantoprazole 40 MG tablet ?Commonly known as: PROTONIX ?  ? ?  ? ? ?Allergies:  ?Allergies  ?Allergen Reactions  ? Shellfish Allergy Hives and Other (See Comments)  ?  Other reaction(s): Other (See Comments) ?Other reaction(s): Unknown ?Told on skin testing that he is extremely allergic ?Other reaction(s): Unknown ?Told on skin testing that he is extremely allergic ?Told on skin  testing that he is extremely allergic ?Told on skin testing that he is extremely allergic ?  ? Shellfish-Derived Products   ?  Other reaction(s): Unknown  ? ? ?Family History: ?Family History  ?Problem Relation Age of Onset  ? Cancer Mother   ? Asthma Father   ? ? ?Social History:  reports that he has never smoked. He uses smokeless tobacco. He reports current alcohol use. He reports that he does not use drugs. ? ?ROS: ?All other review of systems were reviewed and are negative except what is noted above in HPI ? ?Physical Exam: ?BP 133/87   Pulse 76   Wt 250 lb (113.4 kg)   BMI 35.36 kg/m?   ?Constitutional:  Alert and oriented, No acute distress. ?HEENT:  AT, moist mucus membranes.  Trachea midline, no masses. ?Cardiovascular: No clubbing, cyanosis, or edema. ?Respiratory: Normal respiratory effort, no increased work of breathing. ?GI: Abdomen is soft, nontender, nondistended, no abdominal masses ?GU: No CVA tenderness. Uncircumcised phallus. No masses/lesions on penis, testis, scrotum. Palpable 1.5cm dorsal peyronie plaque at mid penile shaft. ?Lymph: No cervical or inguinal lymphadenopathy. ?Skin: No rashes, bruises or suspicious lesions. ?Neurologic: Grossly intact, no focal deficits, moving all 4 extremities. ?Psychiatric: Normal mood and affect. ? ?Laboratory Data: ?Lab Results  ?Component Value Date  ? WBC 5.3 09/18/2016  ? HGB 14.4 09/18/2016  ? HCT  42.2 09/18/2016  ? MCV 90.6 09/18/2016  ? PLT 213 09/18/2016  ? ? ?Lab Results  ?Component Value Date  ? CREATININE 1.32 09/18/2016  ? ? ?No results found for: PSA ? ?No results found for: TESTOSTERONE ? ?No results found for: HGBA1C ? ?Urinalysis ?   ?Component Value Date/Time  ? COLORURINE Yellow 10/11/2013 1303  ? APPEARANCEUR Clear 05/01/2021 1513  ? LABSPEC 1.028 10/11/2013 1303  ? PHURINE 5.0 10/11/2013 1303  ? GLUCOSEU Negative 05/01/2021 1513  ? GLUCOSEU Negative 10/11/2013 1303  ? HGBUR Negative 10/11/2013 1303  ? BILIRUBINUR Negative 05/01/2021  1513  ? BILIRUBINUR Negative 10/11/2013 1303  ? KETONESUR Trace 10/11/2013 1303  ? PROTEINUR Negative 05/01/2021 1513  ? PROTEINUR Negative 10/11/2013 1303  ? NITRITE Negative 05/01/2021 1513  ? NITRITE Negative 10/11/2013 1303  ? LEUKOCYTESUR Negative 05/01/2021 1513  ? LEUKOCYTESUR Negative 10/11/2013 1303  ? ? ?Lab Results  ?Component Value Date  ? LABMICR Comment 05/01/2021  ? BACTERIA NONE SEEN 10/11/2013  ? ? ?Pertinent Imaging: ? ?No results found for this or any previous visit. ? ?No results found for this or any previous visit. ? ?No results found for this or any previous visit. ? ?No results found for this or any previous visit. ? ?No results found for this or any previous visit. ? ?No results found for this or any previous visit. ? ?No results found for this or any previous visit. ? ?No results found for this or any previous visit. ? ? ?Assessment & Plan:   ? ?1. Peyronie disease ?We discussed the management of peyronies disease including medical therapy, penile plication, verapamil therapy and xiaflex therapy. After discussed the options the patient elects for medical therapy. I will see him back in 2-3 months. We will trial trental '400mg'$  BID. ? ? ?No follow-ups on file. ? ?Nicolette Bang, MD ? ?[facility] ?  ?

## 2021-05-19 NOTE — Patient Instructions (Signed)
Collagenase injection (Dupuytren's Contracture/Peyronie's Disease) ?What is this medication? ?COLLAGENASE (kohl LAH jen ace) is used to treat Dupuytren's contracture. This medicine may help straighten a bent finger by breaking up hard tissue. It is also used for Peyronie's disease by breaking up the hard tissue plaque that causes the curvature in the penis. ?This medicine may be used for other purposes; ask your health care provider or pharmacist if you have questions. ?COMMON BRAND NAME(S): Xiaflex ?What should I tell my care team before I take this medication? ?They need to know if you have any of these conditions: ?hemophilia ?low platelet counts ?take medicines that treat or prevent blood clots ?an unusual or allergic reaction to collagenase, other medicines, foods, dyes, or preservatives ?pregnant or trying to get pregnant ?breast-feeding ?How should I use this medication? ?This medicine is for injection into the hand or penis. It is given by a health care professional in a hospital or clinic setting. ?A special MedGuide will be given to you by the pharmacist with each prescription and refill. Be sure to read this information carefully each time. ?Talk to your pediatrician regarding the use of this medicine in children. Special care may be needed. ?Overdosage: If you think you have taken too much of this medicine contact a poison control center or emergency room at once. ?NOTE: This medicine is only for you. Do not share this medicine with others. ?What if I miss a dose? ?It is important not to miss your dose. Call your doctor or health care professional if you are unable to keep an appointment. ?What may interact with this medication? ?aspirin and aspirin-like medicines ?certain medicines that treat or prevent blood clots like warfarin, enoxaparin, and dalteparin ?This list may not describe all possible interactions. Give your health care provider a list of all the medicines, herbs, non-prescription drugs, or  dietary supplements you use. Also tell them if you smoke, drink alcohol, or use illegal drugs. Some items may interact with your medicine. ?What should I watch for while using this medication? ?Your condition will be monitored carefully while you are receiving this medicine. ?If being treated for Dupuytren's contracture, return to your healthcare provider the day after your hand is injected. In the meantime, do not flex or extend the fingers of your hand that was injected. Do not touch your finger that was injected, and elevate your hand until bedtime. Do not perform activity with the injected hand until you are told that it is OK. Follow any instructions about wearing a splint or performing finger exercises. Also, call your healthcare provider if you get increasing redness or swelling in the hand, if you have numbness or tingling in the treated finger, or if you have trouble bending the finger after the swelling goes down. ?If being treated for Peyronie's disease, you will need to return to your healthcare provider for a manual procedure that will stretch and help straighten your penis. Also, your healthcare provider will show you how to gently stretch your penis at home. Do not resume sexual activity until you are told that it is okay. Follow instructions on when to return for follow-up visits. Immediately call your doctor if you have trouble stretching or straightening your penis, or if you have pain or other concerns. ?Immediately call your healthcare provider if you get a fever or chills. ?What side effects may I notice from receiving this medication? ?Side effects that you should report to your doctor or health care professional as soon as possible: ?allergic reactions like  skin rash, itching or hives, swelling of the face, lips, or tongue ?chest pain or palpitations ?infection (fever, chills, increasing redness or swelling at treated site) ?numbness or tingling in a treated hand ?popping sound or sensation in  an erect penis ?sudden back pain or trouble walking ?sudden loss of the ability to maintain an erection ?trouble breathing ?trouble urinating or blood in the urine ?unusual pain, swelling, or bruising of the penis ?Side effects that usually do not require medical attention (report to your doctor or health care professional if they continue or are bothersome): ?bleeding or bruising at site where injected ?irritation at site where injected ?mild pain or swelling at site where injected ?This list may not describe all possible side effects. Call your doctor for medical advice about side effects. You may report side effects to FDA at 1-800-FDA-1088. ?Where should I keep my medication? ?This drug is given in a hospital or clinic and will not be stored at home. ?NOTE: This sheet is a summary. It may not cover all possible information. If you have questions about this medicine, talk to your doctor, pharmacist, or health care provider. ?? 2022 Elsevier/Gold Standard (2019-06-17 00:00:00) ? ?

## 2021-05-20 ENCOUNTER — Ambulatory Visit: Payer: 59 | Admitting: Urology

## 2021-06-17 DIAGNOSIS — F419 Anxiety disorder, unspecified: Secondary | ICD-10-CM | POA: Diagnosis not present

## 2021-06-17 DIAGNOSIS — R69 Illness, unspecified: Secondary | ICD-10-CM | POA: Diagnosis not present

## 2021-07-01 DIAGNOSIS — F419 Anxiety disorder, unspecified: Secondary | ICD-10-CM | POA: Diagnosis not present

## 2021-07-01 DIAGNOSIS — R69 Illness, unspecified: Secondary | ICD-10-CM | POA: Diagnosis not present

## 2021-07-03 DIAGNOSIS — E782 Mixed hyperlipidemia: Secondary | ICD-10-CM | POA: Diagnosis not present

## 2021-07-03 DIAGNOSIS — Z6835 Body mass index (BMI) 35.0-35.9, adult: Secondary | ICD-10-CM | POA: Diagnosis not present

## 2021-07-03 DIAGNOSIS — Z131 Encounter for screening for diabetes mellitus: Secondary | ICD-10-CM | POA: Diagnosis not present

## 2021-07-03 DIAGNOSIS — Z0000 Encounter for general adult medical examination without abnormal findings: Secondary | ICD-10-CM | POA: Diagnosis not present

## 2021-07-03 DIAGNOSIS — Z13220 Encounter for screening for lipoid disorders: Secondary | ICD-10-CM | POA: Diagnosis not present

## 2021-07-03 DIAGNOSIS — R69 Illness, unspecified: Secondary | ICD-10-CM | POA: Diagnosis not present

## 2021-07-03 DIAGNOSIS — E0590 Thyrotoxicosis, unspecified without thyrotoxic crisis or storm: Secondary | ICD-10-CM | POA: Diagnosis not present

## 2021-07-03 DIAGNOSIS — R790 Abnormal level of blood mineral: Secondary | ICD-10-CM | POA: Diagnosis not present

## 2021-07-03 DIAGNOSIS — K760 Fatty (change of) liver, not elsewhere classified: Secondary | ICD-10-CM | POA: Diagnosis not present

## 2021-07-03 DIAGNOSIS — L989 Disorder of the skin and subcutaneous tissue, unspecified: Secondary | ICD-10-CM | POA: Diagnosis not present

## 2021-07-03 DIAGNOSIS — Z716 Tobacco abuse counseling: Secondary | ICD-10-CM | POA: Diagnosis not present

## 2021-07-03 DIAGNOSIS — Z713 Dietary counseling and surveillance: Secondary | ICD-10-CM | POA: Diagnosis not present

## 2021-07-03 DIAGNOSIS — Z1159 Encounter for screening for other viral diseases: Secondary | ICD-10-CM | POA: Diagnosis not present

## 2021-07-08 ENCOUNTER — Encounter: Payer: Self-pay | Admitting: Urology

## 2021-07-08 ENCOUNTER — Ambulatory Visit: Payer: 59 | Admitting: Urology

## 2021-07-08 VITALS — BP 111/74 | HR 61

## 2021-07-08 DIAGNOSIS — N486 Induration penis plastica: Secondary | ICD-10-CM

## 2021-07-08 DIAGNOSIS — N5201 Erectile dysfunction due to arterial insufficiency: Secondary | ICD-10-CM | POA: Diagnosis not present

## 2021-07-08 MED ORDER — PENTOXIFYLLINE ER 400 MG PO TBCR: 400.0000 mg | EXTENDED_RELEASE_TABLET | Freq: Three times a day (TID) | ORAL | 2 refills | Status: DC

## 2021-07-08 NOTE — Progress Notes (Signed)
? ?07/08/2021 ?8:48 AM  ? ?David Fleming ?1975/08/03 ?[id number] ? ?Referring provider: Alfonse Flavors, MD ?[address] ?[place],  [place] [address] ? ?Followup peyronies disease ? ? ?HPI: ?David Fleming is a 46yo here for followup for peyronies disease. He was last seen 6 weeks ago and since then he has noted improvement in his penile pain. No change in penile curvature. NO pain with erections. He palpates less plaque on his penile shaft. He erectile dysfunction has improved. No other complaints today.  ? ? ?PMH: ?Past Medical History:  ?Diagnosis Date  ? Carpal tunnel syndrome   ? Dyspepsia   ? Liver lesion   ? Multinodular goiter   ? ? ?Surgical History: ?No past surgical history on file. ? ?Home Medications:  ?Allergies as of 07/08/2021   ? ?   Reactions  ? Shellfish Allergy Hives, Other (See Comments)  ? Other reaction(s): Other (See Comments) ?Other reaction(s): Unknown ?Told on skin testing that he is extremely allergic ?Other reaction(s): Unknown ?Told on skin testing that he is extremely allergic ?Told on skin testing that he is extremely allergic ?Told on skin testing that he is extremely allergic  ? Shellfish-derived Products   ? Other reaction(s): Unknown  ? ?  ? ?  ?Medication List  ?  ? ?  ? Accurate as of Jul 08, 2021  8:48 AM. If you have any questions, ask your nurse or doctor.  ?  ?  ? ?  ? ?Cetirizine HCl 10 MG Caps ?Take by mouth. ?  ?cetirizine 10 MG tablet ?Commonly known as: ZYRTEC ?Take 10 mg by mouth daily. ?  ?escitalopram 10 MG tablet ?Commonly known as: LEXAPRO ?Take 10 mg by mouth daily. ?  ?hydrOXYzine 10 MG tablet ?Commonly known as: ATARAX ?Take 10 mg by mouth 3 (three) times daily. ?  ?pantoprazole 40 MG tablet ?Commonly known as: PROTONIX ?  ?pentoxifylline 400 MG CR tablet ?Commonly known as: TRENTAL ?Take 1 tablet (400 mg total) by mouth 3 (three) times daily with meals. ?  ? ?  ? ? ?Allergies:  ?Allergies  ?Allergen Reactions  ? Shellfish Allergy Hives and Other (See  Comments)  ?  Other reaction(s): Other (See Comments) ?Other reaction(s): Unknown ?Told on skin testing that he is extremely allergic ?Other reaction(s): Unknown ?Told on skin testing that he is extremely allergic ?Told on skin testing that he is extremely allergic ?Told on skin testing that he is extremely allergic ?  ? Shellfish-Derived Products   ?  Other reaction(s): Unknown  ? ? ?Family History: ?Family History  ?Problem Relation Age of Onset  ? Cancer Mother   ? Asthma Father   ? ? ?Social History:  reports that he has never smoked. He uses smokeless tobacco. He reports current alcohol use. He reports that he does not use drugs. ? ?ROS: ?All other review of systems were reviewed and are negative except what is noted above in HPI ? ?Physical Exam: ?BP 111/74   Pulse 61   ?Constitutional:  Alert and oriented, No acute distress. ?HEENT:  AT, moist mucus membranes.  Trachea midline, no masses. ?Cardiovascular: No clubbing, cyanosis, or edema. ?Respiratory: Normal respiratory effort, no increased work of breathing. ?GI: Abdomen is soft, nontender, nondistended, no abdominal masses ?GU: No CVA tenderness. Circumcised phallus. No masses/lesions on penis, testis, scrotum. Prostate 40g smooth no nodules no induration.  ?Lymph: No cervical or inguinal lymphadenopathy. ?Skin: No rashes, bruises or suspicious lesions. ?Neurologic: Grossly intact, no focal deficits, moving all  4 extremities. ?Psychiatric: Normal mood and affect. ? ?Laboratory Data: ?Lab Results  ?Component Value Date  ? WBC 5.3 09/18/2016  ? HGB 14.4 09/18/2016  ? HCT 42.2 09/18/2016  ? MCV 90.6 09/18/2016  ? PLT 213 09/18/2016  ? ? ?Lab Results  ?Component Value Date  ? CREATININE 1.32 09/18/2016  ? ? ?No results found for: PSA ? ?No results found for: TESTOSTERONE ? ?No results found for: HGBA1C ? ?Urinalysis ?   ?Component Value Date/Time  ? COLORURINE Yellow 10/11/2013 1303  ? APPEARANCEUR Clear 05/01/2021 1513  ? LABSPEC 1.028 10/11/2013 1303  ?  PHURINE 5.0 10/11/2013 1303  ? GLUCOSEU Negative 05/01/2021 1513  ? GLUCOSEU Negative 10/11/2013 1303  ? HGBUR Negative 10/11/2013 1303  ? BILIRUBINUR Negative 05/01/2021 1513  ? BILIRUBINUR Negative 10/11/2013 1303  ? KETONESUR Trace 10/11/2013 1303  ? PROTEINUR Negative 05/01/2021 1513  ? PROTEINUR Negative 10/11/2013 1303  ? NITRITE Negative 05/01/2021 1513  ? NITRITE Negative 10/11/2013 1303  ? LEUKOCYTESUR Negative 05/01/2021 1513  ? LEUKOCYTESUR Negative 10/11/2013 1303  ? ? ?Lab Results  ?Component Value Date  ? LABMICR Comment 05/01/2021  ? BACTERIA NONE SEEN 10/11/2013  ? ? ?Pertinent Imaging: ? ?No results found for this or any previous visit. ? ?No results found for this or any previous visit. ? ?No results found for this or any previous visit. ? ?No results found for this or any previous visit. ? ?No results found for this or any previous visit. ? ?No results found for this or any previous visit. ? ?No results found for this or any previous visit. ? ?No results found for this or any previous visit. ? ? ?Assessment & Plan:   ? ?1. Peyronie disease ?-We discussed the management of peyronies disease including medical therapy, penile plication, verapamil therapy and xiaflex therapy. After discussed the options the patient elects for xiaflex therapy. I will see him back in 8 weeks with pictures of his erection ? ? ? ?No follow-ups on file. ? ?Nicolette Bang, MD ? ?[facility] ? ? ?

## 2021-07-08 NOTE — Patient Instructions (Signed)
Collagenase Injection (Dupuytren Contracture/Peyronie Disease) What is this medication? COLLAGENASE (kohl LAH jen ace) treats conditions caused by thickening of tissue in your body. It works by breaking down excess collagen in the tissue, which reduces stiffness and tightness. This medicine may be used for other purposes; ask your health care provider or pharmacist if you have questions. COMMON BRAND NAME(S): Xiaflex What should I tell my care team before I take this medication? They need to know if you have any of these conditions: Bleeding disorder An unusual or allergic reaction to collagenase, other medications, foods, dyes, or preservatives Pregnant or trying to get pregnant Breast-feeding How should I use this medication? This medication is injected into the affected area. It is given by your care team in a hospital or clinic setting. A special MedGuide will be given to you by the pharmacist with each prescription and refill. Be sure to read this information carefully each time. Talk to your care team about the use of this medication in children. Special care may be needed. Overdosage: If you think you have taken too much of this medicine contact a poison control center or emergency room at once. NOTE: This medicine is only for you. Do not share this medicine with others. What if I miss a dose? Keep appointments for follow-up doses. It is important not to miss your dose. Call your care team if you are unable to keep an appointment. What may interact with this medication? Aspirin and aspirin-like medications Certain medications that treat or prevent blood clots, such as warfarin, enoxaparin, dalteparin, apixaban, dabigatran, rivaroxaban This list may not describe all possible interactions. Give your health care provider a list of all the medicines, herbs, non-prescription drugs, or dietary supplements you use. Also tell them if you smoke, drink alcohol, or use illegal drugs. Some items may  interact with your medicine. What should I watch for while using this medication? Your condition will be monitored carefully while you are receiving this medication. If medication is for Dupuytren's Contracture, visit your care team 1 to 3 days after the injection. Until you visit your care team, do not flex or extend the fingers of your hand that was injected. Do not touch your finger that was injected. Elevate your hand until bedtime. Do not perform activity with the injected hand until you are told that it is ok. Follow any instructions about wearing a splint or performing finger exercises. Contact your care team as soon as possible if you get increasing redness or swelling in the hand, have numbness or tingling in the treated finger, or have trouble bending the finger after the swelling goes down. If medication is for Peyronie's disease, do not have sex between the first and second injections. Wait 4 weeks after the second injection and when there is no more pain or swelling in the penis to have sex. Avoid using vacuum erection devices during treatment with this medication. Try to avoid straining stomach muscles such as during bowel movements. Your care team will give you instructions on how to perform modeling activities at home. Contact your care team as soon as possible if you have severe pain or swelling in the penis, severe purple bruising and swelling of the penis, trouble passing urine, blood in urine, popping or cracking sound form the penis, or sudden loss of ability to maintain an erection. What side effects may I notice from receiving this medication? Side effects that you should report to your care team as soon as possible: Allergic reactions--skin rash,   itching, hives, swelling of the face, lips, tongue, or throat Feeling faint or lightheaded Skin infection--skin redness, swelling, warmth, or pain Severe back pain, chest pain, headache, trouble breathing after injection Snap or pop that  you feel or hear, severe pain, numbness, swelling, or bruising of or trouble moving in area where injected Side effects that usually do not require medical attention (report to your care team if they continue or are bothersome): Pain, redness, or irritation at injection site This list may not describe all possible side effects. Call your doctor for medical advice about side effects. You may report side effects to FDA at 1-800-FDA-1088. Where should I keep my medication? This medication is given in a hospital or clinic. It will not be stored at home. NOTE: This sheet is a summary. It may not cover all possible information. If you have questions about this medicine, talk to your doctor, pharmacist, or health care provider.  2023 Elsevier/Gold Standard (2021-01-31 00:00:00)  

## 2021-08-05 ENCOUNTER — Ambulatory Visit: Payer: 59 | Admitting: Urology

## 2021-08-05 DIAGNOSIS — F419 Anxiety disorder, unspecified: Secondary | ICD-10-CM | POA: Diagnosis not present

## 2021-08-05 DIAGNOSIS — R69 Illness, unspecified: Secondary | ICD-10-CM | POA: Diagnosis not present

## 2021-08-18 ENCOUNTER — Ambulatory Visit: Payer: 59 | Admitting: Urology

## 2021-08-18 DIAGNOSIS — N486 Induration penis plastica: Secondary | ICD-10-CM

## 2021-08-27 ENCOUNTER — Encounter: Payer: Self-pay | Admitting: Urology

## 2021-08-27 ENCOUNTER — Ambulatory Visit: Payer: 59 | Admitting: Urology

## 2021-08-27 VITALS — BP 110/74 | HR 74

## 2021-08-27 DIAGNOSIS — N486 Induration penis plastica: Secondary | ICD-10-CM | POA: Diagnosis not present

## 2021-08-27 LAB — URINALYSIS, ROUTINE W REFLEX MICROSCOPIC
Bilirubin, UA: NEGATIVE
Glucose, UA: NEGATIVE
Leukocytes,UA: NEGATIVE
Nitrite, UA: NEGATIVE
RBC, UA: NEGATIVE
Specific Gravity, UA: 1.02 (ref 1.005–1.030)
Urobilinogen, Ur: 4 mg/dL — ABNORMAL HIGH (ref 0.2–1.0)
pH, UA: 7 (ref 5.0–7.5)

## 2021-08-27 MED ORDER — PENTOXIFYLLINE ER 400 MG PO TBCR: 400.0000 mg | EXTENDED_RELEASE_TABLET | Freq: Three times a day (TID) | ORAL | 2 refills | Status: DC

## 2021-08-27 NOTE — Progress Notes (Signed)
08/27/2021 3:44 PM   David Fleming Aug 23, 1975 [id number]  Referring provider: Alfonse Flavors, MD [address] [place],  [place] [address]  Followup peyronies disease   HPI:  David Fleming is a 46yo here for followup for peyronies disease. He notes improvement in his curvature since last visit. No penile pain. He remains on trental BID. He notes improvement in his erections on the trental.   PMH: Past Medical History:  Diagnosis Date   Carpal tunnel syndrome    Dyspepsia    Liver lesion    Multinodular goiter     Surgical History: No past surgical history on file.  Home Medications:  Allergies as of 08/27/2021       Reactions   Shellfish Allergy Hives, Other (See Comments)   Other reaction(s): Other (See Comments) Other reaction(s): Unknown Told on skin testing that he is extremely allergic Other reaction(s): Unknown Told on skin testing that he is extremely allergic Told on skin testing that he is extremely allergic Told on skin testing that he is extremely allergic   Shellfish-derived Products    Other reaction(s): Unknown        Medication List        Accurate as of August 27, 2021  3:44 PM. If you have any questions, ask your nurse or doctor.          Cetirizine HCl 10 MG Caps Take by mouth.   cetirizine 10 MG tablet Commonly known as: ZYRTEC Take 10 mg by mouth daily.   escitalopram 10 MG tablet Commonly known as: LEXAPRO Take 10 mg by mouth daily.   hydrOXYzine 10 MG tablet Commonly known as: ATARAX Take 10 mg by mouth 3 (three) times daily.   pantoprazole 40 MG tablet Commonly known as: PROTONIX   pentoxifylline 400 MG CR tablet Commonly known as: TRENTAL Take 1 tablet (400 mg total) by mouth 3 (three) times daily with meals.        Allergies:  Allergies  Allergen Reactions   Shellfish Allergy Hives and Other (See Comments)    Other reaction(s): Other (See Comments) Other reaction(s): Unknown Told on skin testing  that he is extremely allergic Other reaction(s): Unknown Told on skin testing that he is extremely allergic Told on skin testing that he is extremely allergic Told on skin testing that he is extremely allergic    Shellfish-Derived Products     Other reaction(s): Unknown    Family History: Family History  Problem Relation Age of Onset   Cancer Mother    Asthma Father     Social History:  reports that he has never smoked. He uses smokeless tobacco. He reports current alcohol use. He reports that he does not use drugs.  ROS: All other review of systems were reviewed and are negative except what is noted above in HPI  Physical Exam: BP 110/74   Pulse 74   Constitutional:  Alert and oriented, No acute distress. HEENT:  AT, moist mucus membranes.  Trachea midline, no masses. Cardiovascular: No clubbing, cyanosis, or edema. Respiratory: Normal respiratory effort, no increased work of breathing. GI: Abdomen is soft, nontender, nondistended, no abdominal masses GU: No CVA tenderness.  Lymph: No cervical or inguinal lymphadenopathy. Skin: No rashes, bruises or suspicious lesions. Neurologic: Grossly intact, no focal deficits, moving all 4 extremities. Psychiatric: Normal mood and affect.  Laboratory Data: Lab Results  Component Value Date   WBC 5.3 09/18/2016   HGB 14.4 09/18/2016   HCT 42.2 09/18/2016  MCV 90.6 09/18/2016   PLT 213 09/18/2016    Lab Results  Component Value Date   CREATININE 1.32 09/18/2016    No results found for: "PSA"  No results found for: "TESTOSTERONE"  No results found for: "HGBA1C"  Urinalysis    Component Value Date/Time   COLORURINE Yellow 10/11/2013 1303   APPEARANCEUR Clear 05/01/2021 1513   LABSPEC 1.028 10/11/2013 1303   PHURINE 5.0 10/11/2013 1303   GLUCOSEU Negative 05/01/2021 1513   GLUCOSEU Negative 10/11/2013 1303   HGBUR Negative 10/11/2013 1303   BILIRUBINUR Negative 05/01/2021 1513   BILIRUBINUR Negative 10/11/2013  1303   KETONESUR Trace 10/11/2013 1303   PROTEINUR Negative 05/01/2021 1513   PROTEINUR Negative 10/11/2013 1303   NITRITE Negative 05/01/2021 1513   NITRITE Negative 10/11/2013 1303   LEUKOCYTESUR Negative 05/01/2021 1513   LEUKOCYTESUR Negative 10/11/2013 1303    Lab Results  Component Value Date   LABMICR Comment 05/01/2021   BACTERIA NONE SEEN 10/11/2013    Pertinent Imaging:  No results found for this or any previous visit.  No results found for this or any previous visit.  No results found for this or any previous visit.  No results found for this or any previous visit.  No results found for this or any previous visit.  No results found for this or any previous visit.  No results found for this or any previous visit.  No results found for this or any previous visit.   Assessment & Plan:    1. Peyronie disease We discussed the management of peyronies disease including medical therapy, penile plication, verapamil therapy and xiaflex therapy. After discussed the options the patient elects for xiaflex therapy. Patient to bring pictures of his erection to his next visit.    No follow-ups on file.  Nicolette Bang, MD  [facility] [place]

## 2021-08-27 NOTE — Patient Instructions (Signed)
Collagenase Injection (Dupuytren Contracture/Peyronie Disease) What is this medication? COLLAGENASE (kohl LAH jen ace) treats conditions caused by thickening of tissue in your body. It works by breaking down excess collagen in the tissue, which reduces stiffness and tightness. This medicine may be used for other purposes; ask your health care provider or pharmacist if you have questions. COMMON BRAND NAME(S): Xiaflex What should I tell my care team before I take this medication? They need to know if you have any of these conditions: Bleeding disorder An unusual or allergic reaction to collagenase, other medications, foods, dyes, or preservatives Pregnant or trying to get pregnant Breast-feeding How should I use this medication? This medication is injected into the affected area. It is given by your care team in a hospital or clinic setting. A special MedGuide will be given to you by the pharmacist with each prescription and refill. Be sure to read this information carefully each time. Talk to your care team about the use of this medication in children. Special care may be needed. Overdosage: If you think you have taken too much of this medicine contact a poison control center or emergency room at once. NOTE: This medicine is only for you. Do not share this medicine with others. What if I miss a dose? Keep appointments for follow-up doses. It is important not to miss your dose. Call your care team if you are unable to keep an appointment. What may interact with this medication? Aspirin and aspirin-like medications Certain medications that treat or prevent blood clots, such as warfarin, enoxaparin, dalteparin, apixaban, dabigatran, rivaroxaban This list may not describe all possible interactions. Give your health care provider a list of all the medicines, herbs, non-prescription drugs, or dietary supplements you use. Also tell them if you smoke, drink alcohol, or use illegal drugs. Some items may  interact with your medicine. What should I watch for while using this medication? Your condition will be monitored carefully while you are receiving this medication. If medication is for Dupuytren's Contracture, visit your care team 1 to 3 days after the injection. Until you visit your care team, do not flex or extend the fingers of your hand that was injected. Do not touch your finger that was injected. Elevate your hand until bedtime. Do not perform activity with the injected hand until you are told that it is ok. Follow any instructions about wearing a splint or performing finger exercises. Contact your care team as soon as possible if you get increasing redness or swelling in the hand, have numbness or tingling in the treated finger, or have trouble bending the finger after the swelling goes down. If medication is for Peyronie's disease, do not have sex between the first and second injections. Wait 4 weeks after the second injection and when there is no more pain or swelling in the penis to have sex. Avoid using vacuum erection devices during treatment with this medication. Try to avoid straining stomach muscles such as during bowel movements. Your care team will give you instructions on how to perform modeling activities at home. Contact your care team as soon as possible if you have severe pain or swelling in the penis, severe purple bruising and swelling of the penis, trouble passing urine, blood in urine, popping or cracking sound form the penis, or sudden loss of ability to maintain an erection. What side effects may I notice from receiving this medication? Side effects that you should report to your care team as soon as possible: Allergic reactions--skin rash,  itching, hives, swelling of the face, lips, tongue, or throat Feeling faint or lightheaded Skin infection--skin redness, swelling, warmth, or pain Severe back pain, chest pain, headache, trouble breathing after injection Snap or pop that  you feel or hear, severe pain, numbness, swelling, or bruising of or trouble moving in area where injected Side effects that usually do not require medical attention (report to your care team if they continue or are bothersome): Pain, redness, or irritation at injection site This list may not describe all possible side effects. Call your doctor for medical advice about side effects. You may report side effects to FDA at 1-800-FDA-1088. Where should I keep my medication? This medication is given in a hospital or clinic. It will not be stored at home. NOTE: This sheet is a summary. It may not cover all possible information. If you have questions about this medicine, talk to your doctor, pharmacist, or health care provider.  2023 Elsevier/Gold Standard (2021-01-31 00:00:00)

## 2021-11-26 ENCOUNTER — Ambulatory Visit: Payer: 59 | Admitting: Urology

## 2022-03-01 ENCOUNTER — Other Ambulatory Visit: Payer: Self-pay | Admitting: Urology

## 2022-07-22 ENCOUNTER — Encounter: Payer: Self-pay | Admitting: Internal Medicine

## 2022-07-22 ENCOUNTER — Ambulatory Visit: Payer: 59 | Attending: Internal Medicine | Admitting: Internal Medicine

## 2022-07-22 ENCOUNTER — Ambulatory Visit: Payer: 59 | Admitting: Internal Medicine

## 2022-07-22 VITALS — BP 116/76 | HR 66 | Ht 71.0 in | Wt 250.0 lb

## 2022-07-22 DIAGNOSIS — R0609 Other forms of dyspnea: Secondary | ICD-10-CM | POA: Diagnosis not present

## 2022-07-22 DIAGNOSIS — R0602 Shortness of breath: Secondary | ICD-10-CM

## 2022-07-22 DIAGNOSIS — R079 Chest pain, unspecified: Secondary | ICD-10-CM | POA: Diagnosis not present

## 2022-07-22 DIAGNOSIS — E8889 Other specified metabolic disorders: Secondary | ICD-10-CM | POA: Diagnosis not present

## 2022-07-22 MED ORDER — METOPROLOL TARTRATE 100 MG PO TABS
ORAL_TABLET | ORAL | 0 refills | Status: AC
Start: 2022-07-22 — End: ?

## 2022-07-22 NOTE — Progress Notes (Unsigned)
Cardiology Office Note   Date:  07/22/2022   ID:  David Fleming, DOB [birth year], MRN [id number]  PCP:  Tanna Furry, MD  Cardiologist:   Dietrich Pates, MD   Pt referred for dyspnea, tired   ALso complains of chest pressure     History of Present Illness: David Fleming is a 47 y.o. male with a history of carpel tunnel syndrome (bilateral), GERD, IBS, fatty liver dz, reported HL, HTN   Seen in IM by Abundio Miu in March 2024.  He is referred for evaluation of intermitt L sided CP   and worsening DOE, fatigue   The patient says he  gets intermittent SSCP   Comes and goes   This Thursday he had a spell of epigastric discomfort that went into chest   Friday worse   Continued through weekend   Monday eased off.  He also notes more DOE and just giving out with activity  Tires easily    Note brother with CAD (does smoke)    Diet: Breakfast:   Pack of oatmeal  Coffee Creamer and sugar Lunch:   Spagghitos  Water   Occasional sprite Dinner  Varies     (hot dog, hamburger) 3 to 5 cups coffee with cream/ sugar     Current Meds  Medication Sig   cetirizine (ZYRTEC) 10 MG tablet Take 10 mg by mouth daily.   escitalopram (LEXAPRO) 10 MG tablet Take 10 mg by mouth daily.   pantoprazole (PROTONIX) 40 MG tablet 40 mg daily.   pentoxifylline (TRENTAL) 400 MG CR tablet TAKE (1) TABLET BY MOUTH THREE TIMES DAILY WITH MEALS. (Patient taking differently: 400 mg daily.)     Allergies:   Shellfish allergy and Shellfish-derived products   Past Medical History:  Diagnosis Date   Carpal tunnel syndrome    Dyspepsia    Liver lesion    Multinodular goiter     Past Surgical History:  Procedure Laterality Date   MULTIPLE TOOTH EXTRACTIONS       Social History:  The patient  reports that he has never smoked. His smokeless tobacco use includes chew. He reports current alcohol use. He reports that he does not use drugs.   Family History:  The patient's family history includes Asthma in his  father; Cancer in his mother.    ROS:  Please see the history of present illness. All other systems are reviewed and  Negative to the above problem except as noted.    PHYSICAL EXAM: VS:  BP 116/76   Pulse 66   Ht 5\' 11"  (1.803 m)   Wt 250 lb (113.4 kg)   SpO2 96%   BMI 34.87 kg/m   GEN:  Pt is in NAD  HEENT: normal  Neck: no JVD, carotid bruits Cardiac: RRR; no murmurs,  No LE edema  Respiratory:  clear to auscultation bilaterally, GI: soft, nontender, nondistended,  No hepatomegaly  MS: no deformity Moving all extremities   Skin: warm and dry, no rash Neuro:  Strength and sensation are intact Psych: euthymic mood, full affect   EKG:  EKG is ordered today.  NSR  66 ST changes consistent with early repolarization    Lipid Panel No results found for: "CHOL", "TRIG", "HDL", "CHOLHDL", "VLDL", "LDLCALC", "LDLDIRECT"    Wt Readings from Last 3 Encounters:  07/22/22 250 lb (113.4 kg)  05/19/21 250 lb (113.4 kg)  05/01/21 254 lb (115.2 kg)      ASSESSMENT AND PLAN:  1  Chest pain  Atypical for cardiac  SOunds more like GI   With SOB should be evaluated  2 DOE   Concerning   Hx carpel tunnel  as well Recomm  Echo to eval systolic / diastolic function CT coronary angiogram  3   Hx HL   will get lipids  4  Metabolic    Hx fatty liver changes     Discussed diet  Recomm low sugar, minimze processed food Will get Hgb A1C      Current medicines are reviewed at length with the patient today.  The patient does not have concerns regarding medicines.  Signed, Dietrich Pates, MD  07/22/2022 8:07 AM    [facility] [address] [place], [place], [place]  [address] Phone: [phone]; Fax: [phone]

## 2022-07-22 NOTE — Patient Instructions (Signed)
Medication Instructions:  Your physician recommends that you continue on your current medications as directed. Please refer to the Current Medication list given to you today.   Labwork: BMET, TROPONIN, NMR,Apo-B,LP(a), A1C  Testing/Procedures: Your physician has requested that you have an echocardiogram. Echocardiography is a painless test that uses sound waves to create images of your heart. It provides your doctor with information about the size and shape of your heart and how well your heart's chambers and valves are working. This procedure takes approximately one hour. There are no restrictions for this procedure. Please do NOT wear cologne, perfume, aftershave, or lotions (deodorant is allowed). Please arrive 15 minutes prior to your appointment time.    Your physician has requested that you have cardiac CT. Cardiac computed tomography (CT) is a painless test that uses an x-ray machine to take clear, detailed pictures of your heart. For further information please visit [email or web address] Please follow instruction sheet as given.    Follow-Up: To be determined after testing  Any Other Special Instructions Will Be Listed Below (If Applicable).  If you need a refill on your cardiac medications before your next appointment, please call your pharmacy.

## 2022-07-23 LAB — BASIC METABOLIC PANEL
BUN/Creatinine Ratio: 8 — ABNORMAL LOW (ref 9–20)
Calcium: 9.2 mg/dL (ref 8.7–10.2)
Chloride: 103 mmol/L (ref 96–106)
Creatinine, Ser: 1.16 mg/dL (ref 0.76–1.27)
Potassium: 4.9 mmol/L (ref 3.5–5.2)

## 2022-07-24 LAB — BASIC METABOLIC PANEL
BUN: 9 mg/dL (ref 6–24)
CO2: 23 mmol/L (ref 20–29)
Glucose: 119 mg/dL — ABNORMAL HIGH (ref 70–99)
Sodium: 141 mmol/L (ref 134–144)
eGFR: 78 mL/min/{1.73_m2} (ref >59)

## 2022-07-24 LAB — NMR, LIPOPROFILE
Cholesterol, Total: 131 mg/dL (ref 100–199)
HDL Particle Number: 22 umol/L — ABNORMAL LOW (ref >=30.5)
HDL-C: 24 mg/dL — ABNORMAL LOW (ref >39)
LDL Particle Number: 939 nmol/L (ref <1000)
LDL Size: 20.5 nm — ABNORMAL LOW (ref >20.5)
LDL-C (NIH Calc): 80 mg/dL (ref 0–99)
LP-IR Score: 100 — ABNORMAL HIGH (ref <=45)
Small LDL Particle Number: 460 nmol/L (ref <=527)
Triglycerides: 150 mg/dL — ABNORMAL HIGH (ref 0–149)

## 2022-07-24 LAB — TROPONIN T: Troponin T (Highly Sensitive): <6 ng/L (ref 0–22)

## 2022-07-24 LAB — LIPOPROTEIN A (LPA): Lipoprotein (a): <8.4 nmol/L (ref <75.0)

## 2022-07-24 LAB — HEMOGLOBIN A1C
Est. average glucose Bld gHb Est-mCnc: 126 mg/dL
Hgb A1c MFr Bld: 6 % — ABNORMAL HIGH (ref 4.8–5.6)

## 2022-07-24 LAB — APOLIPOPROTEIN B: Apolipoprotein B: 68 mg/dL (ref <90)

## 2022-08-17 ENCOUNTER — Telehealth (HOSPITAL_COMMUNITY): Payer: Self-pay | Admitting: Emergency Medicine

## 2022-08-17 NOTE — Telephone Encounter (Signed)
Reaching out to patient to offer assistance regarding upcoming cardiac imaging study; pt verbalizes understanding of appt date/time, parking situation and where to check in, pre-test NPO status and medications ordered, and verified current allergies; name and call back number provided for further questions should they arise Louis Gaw RN Navigator Cardiac Imaging [facility] [phone] office [phone] cell 

## 2022-08-18 ENCOUNTER — Other Ambulatory Visit (HOSPITAL_COMMUNITY): Payer: Self-pay | Admitting: Emergency Medicine

## 2022-08-18 ENCOUNTER — Ambulatory Visit (HOSPITAL_COMMUNITY)
Admission: RE | Admit: 2022-08-18 | Discharge: 2022-08-18 | Disposition: A | Discharge status: Home / Self Care | Payer: 59 | Source: Ambulatory Visit | Attending: Internal Medicine | Admitting: Internal Medicine

## 2022-08-18 DIAGNOSIS — R079 Chest pain, unspecified: Secondary | ICD-10-CM | POA: Diagnosis present

## 2022-08-18 DIAGNOSIS — R0602 Shortness of breath: Secondary | ICD-10-CM | POA: Diagnosis not present

## 2022-08-18 MED ORDER — NITROGLYCERIN 0.4 MG SL SUBL
0.8000 mg | SUBLINGUAL_TABLET | Freq: Once | SUBLINGUAL | Status: AC
  Administered 2022-08-18: 0.8 mg via SUBLINGUAL

## 2022-08-18 MED ORDER — IOHEXOL 350 MG/ML SOLN
95.0000 mL | Freq: Once | INTRAVENOUS | Status: AC | PRN
  Administered 2022-08-18: 95 mL via INTRAVENOUS

## 2022-08-18 MED ORDER — NITROGLYCERIN 0.4 MG SL SUBL
SUBLINGUAL_TABLET | SUBLINGUAL | Status: AC
  Filled 2022-08-18: qty 2

## 2022-09-08 ENCOUNTER — Ambulatory Visit (HOSPITAL_COMMUNITY)
Admission: RE | Admit: 2022-09-08 | Discharge: 2022-09-08 | Disposition: A | Discharge status: Home / Self Care | Payer: 59 | Source: Ambulatory Visit | Attending: Internal Medicine | Admitting: Internal Medicine

## 2022-09-08 DIAGNOSIS — R0602 Shortness of breath: Secondary | ICD-10-CM

## 2022-09-08 LAB — ECHOCARDIOGRAM COMPLETE
Area-P 1/2: 3.27 cm2
S' Lateral: 2.4 cm

## 2022-09-08 NOTE — Progress Notes (Signed)
*  PRELIMINARY RESULTS* Echocardiogram 2D Echocardiogram has been performed.  Stacey Drain 09/08/2022, 12:09 PM

## 2022-11-14 ENCOUNTER — Other Ambulatory Visit: Payer: Self-pay | Admitting: Urology

## 2023-06-05 ENCOUNTER — Other Ambulatory Visit: Payer: Self-pay | Admitting: Urology

## 2023-08-20 ENCOUNTER — Ambulatory Visit: Admitting: Internal Medicine

## 2023-10-02 ENCOUNTER — Other Ambulatory Visit: Payer: Self-pay | Admitting: Urology
